# Patient Record
Sex: Female | Born: 1985 | Race: White | Hispanic: No | Marital: Married | Smoking: Never smoker
Health system: Southern US, Community
[De-identification: ages and names within clinical notes are randomized; demographics above are authoritative.]

## PROBLEM LIST (undated history)

## (undated) DIAGNOSIS — O24419 Gestational diabetes mellitus in pregnancy, unspecified control: Secondary | ICD-10-CM

## (undated) DIAGNOSIS — O139 Gestational [pregnancy-induced] hypertension without significant proteinuria, unspecified trimester: Secondary | ICD-10-CM

## (undated) DIAGNOSIS — O43129 Velamentous insertion of umbilical cord, unspecified trimester: Secondary | ICD-10-CM

## (undated) DIAGNOSIS — J302 Other seasonal allergic rhinitis: Secondary | ICD-10-CM

## (undated) DIAGNOSIS — Z789 Other specified health status: Secondary | ICD-10-CM

## (undated) HISTORY — DX: Other specified health status: Z78.9

## (undated) HISTORY — PX: WISDOM TOOTH EXTRACTION: SHX21

## (undated) HISTORY — DX: Other seasonal allergic rhinitis: J30.2

## (undated) HISTORY — DX: Gestational diabetes mellitus in pregnancy, unspecified control: O24.419

## (undated) HISTORY — DX: Gestational (pregnancy-induced) hypertension without significant proteinuria, unspecified trimester: O13.9

## (undated) HISTORY — DX: Velamentous insertion of umbilical cord, unspecified trimester: O43.129

---

## 2010-06-28 ENCOUNTER — Emergency Department (HOSPITAL_COMMUNITY): Payer: Private Health Insurance - Indemnity

## 2010-06-28 ENCOUNTER — Emergency Department (HOSPITAL_COMMUNITY)
Admission: EM | Admit: 2010-06-28 | Discharge: 2010-06-28 | Disposition: A | Discharge status: Home / Self Care | Payer: Private Health Insurance - Indemnity | Attending: Emergency Medicine | Admitting: Emergency Medicine

## 2010-06-28 DIAGNOSIS — R110 Nausea: Secondary | ICD-10-CM | POA: Insufficient documentation

## 2010-06-28 DIAGNOSIS — R1011 Right upper quadrant pain: Secondary | ICD-10-CM | POA: Insufficient documentation

## 2010-06-28 LAB — COMPREHENSIVE METABOLIC PANEL
ALT: 19 U/L (ref 0–35)
AST: 25 U/L (ref 0–37)
Albumin: 4.5 g/dL (ref 3.5–5.2)
Chloride: 104 mEq/L (ref 96–112)
Creatinine, Ser: 0.88 mg/dL (ref 0.4–1.2)
GFR calc Af Amer: >60 mL/min (ref >60)
Potassium: 3.7 mEq/L (ref 3.5–5.1)
Sodium: 136 mEq/L (ref 135–145)
Total Bilirubin: 0.5 mg/dL (ref 0.3–1.2)

## 2010-06-28 LAB — URINALYSIS, ROUTINE W REFLEX MICROSCOPIC
Glucose, UA: NEGATIVE mg/dL
Hgb urine dipstick: NEGATIVE
pH: 6 (ref 5.0–8.0)

## 2010-06-28 LAB — CBC
Hemoglobin: 14.5 g/dL (ref 12.0–15.0)
MCH: 31.1 pg (ref 26.0–34.0)
Platelets: 228 10*3/uL (ref 150–400)
RBC: 4.66 MIL/uL (ref 3.87–5.11)
WBC: 13 10*3/uL — ABNORMAL HIGH (ref 4.0–10.5)

## 2010-06-28 LAB — DIFFERENTIAL
Basophils Absolute: 0 10*3/uL (ref 0.0–0.1)
Basophils Relative: 0 % (ref 0–1)
Eosinophils Absolute: 0.1 10*3/uL (ref 0.0–0.7)
Neutro Abs: 10.3 10*3/uL — ABNORMAL HIGH (ref 1.7–7.7)
Neutrophils Relative %: 79 % — ABNORMAL HIGH (ref 43–77)

## 2010-06-28 LAB — URINE MICROSCOPIC-ADD ON

## 2015-06-08 ENCOUNTER — Ambulatory Visit (INDEPENDENT_AMBULATORY_CARE_PROVIDER_SITE_OTHER): Payer: BLUE CROSS/BLUE SHIELD | Admitting: Family Medicine

## 2015-06-08 ENCOUNTER — Encounter: Payer: Self-pay | Admitting: Family Medicine

## 2015-06-08 VITALS — BP 143/94 | HR 67 | Ht 61.0 in | Wt 125.0 lb

## 2015-06-08 DIAGNOSIS — S86811A Strain of other muscle(s) and tendon(s) at lower leg level, right leg, initial encounter: Secondary | ICD-10-CM

## 2015-06-08 NOTE — Patient Instructions (Addendum)
You have a calf strain (medial gastrocnemius muscle). Compression sleeve or ace wrap to help with swelling and pain. Icing for 15 minutes at a time 3-4 times a day Elevation above the level of your heart as much as possible. Heel lifts either in temporary orthotics or on their own to prevent further strain. Aleve 2 tabs twice a day with food OR ibuprofen 600mg  three times a day with food for pain and inflammation for 7-10 days then as needed. Start ankle range of motion exercises (up/down and alphabet exercises). When tolerated do double leg calf raises - advance to single leg, finally to doing these on a step.  3 sets of 10 once a day. When able to single leg, you are not limping, and pain is less than 3 on a scale of 1-10 you can try walk: jog program - I doubt you will be able to do this before I see you back. Cycling with low resistance or swimming for cross training in meantime. Follow up with me in 2 weeks.

## 2015-06-11 DIAGNOSIS — S86819A Strain of other muscle(s) and tendon(s) at lower leg level, unspecified leg, initial encounter: Secondary | ICD-10-CM | POA: Insufficient documentation

## 2015-06-11 NOTE — Assessment & Plan Note (Signed)
compression, icing, elevation.  NSAIDs for 7-10 days then as needed.  Shown home exercises to start now and advance to calf raises.  Cross training in meantime.  F/u in 2 weeks for reevaluation.

## 2015-06-11 NOTE — Progress Notes (Signed)
PCP: No primary care provider on file.  Subjective:   HPI: Patient is a 30 y.o. female here for right calf pain.  Patient reports on Tuesday she felt some cramping in her right calf after a 9 mile run. Did ok after this, rested through Friday with some soreness still. Then on Saturday 3 miles into a run she felt a pop medial calf. Is training for a marathon on May 19th. No bruising.  Feels tight. Pain is 3/10 but up to 7/10 if she tries to run, sharp. Wearing compression sleeve, icing. No skin changes, fever.  No past medical history on file.  No current outpatient prescriptions on file prior to visit.   No current facility-administered medications on file prior to visit.    No past surgical history on file.  No Known Allergies  Social History   Social History  . Marital Status: Married    Spouse Name: N/A  . Number of Children: N/A  . Years of Education: N/A   Occupational History  . Not on file.   Social History Main Topics  . Smoking status: Never Smoker   . Smokeless tobacco: Not on file  . Alcohol Use: Not on file  . Drug Use: Not on file  . Sexual Activity: Not on file   Other Topics Concern  . Not on file   Social History Narrative  . No narrative on file    No family history on file.  BP 143/94 mmHg  Pulse 67  Ht 5\' 1"  (1.549 m)  Wt 125 lb (56.7 kg)  BMI 23.63 kg/m2  Review of Systems: See HPI above.    Objective:  Physical Exam:  Gen: NAD, comfortable in exam room  Right lower leg: No gross deformity, obvious swelling or bruising. TTP with spasm medial gastroc.  No other focal tenderness. FROM ankle - pain attempting calf raise and with plantarflexion. NVI distally.    Assessment & Plan:  1. Right calf strain - compression, icing, elevation.  NSAIDs for 7-10 days then as needed.  Shown home exercises to start now and advance to calf raises.  Cross training in meantime.  F/u in 2 weeks for reevaluation.

## 2015-06-22 ENCOUNTER — Encounter: Payer: Self-pay | Admitting: Family Medicine

## 2015-06-22 ENCOUNTER — Ambulatory Visit (INDEPENDENT_AMBULATORY_CARE_PROVIDER_SITE_OTHER): Payer: BLUE CROSS/BLUE SHIELD | Admitting: Family Medicine

## 2015-06-22 VITALS — BP 133/87 | HR 56 | Ht 61.0 in | Wt 120.0 lb

## 2015-06-22 DIAGNOSIS — S86811D Strain of other muscle(s) and tendon(s) at lower leg level, right leg, subsequent encounter: Secondary | ICD-10-CM

## 2015-06-22 NOTE — Patient Instructions (Signed)
Continue with the home exercises through the race even if you're feeling good. Icing still 15 minutes at a time 3-4 times a day. Compression sleeve when running/exercising only now. Continue heel lifts. Ibuprofen only if needed at this point. Ok to start walk/jog 1 minute:1 minute for total of 10 minutes on level surface (track or treadmill) - every other day increase (so 2:1 jog: walk for 15 minutes, 3:1 WUJ:WJXBjog:walk for 20 minutes, etc) - be very slow about this first week. I'd wait about 2 weeks before I tried adding hills or any incline/decline to your runs. Cycling with low resistance or swimming for cross training on off days. Let me know how you're both doing in 2 weeks - typically I would recommend following up with me in 4 weeks. Call me if you want to do physical therapy and we will put in a referral for you.

## 2015-06-24 DIAGNOSIS — J45909 Unspecified asthma, uncomplicated: Secondary | ICD-10-CM | POA: Diagnosis not present

## 2015-06-24 DIAGNOSIS — R05 Cough: Secondary | ICD-10-CM | POA: Diagnosis not present

## 2015-06-24 NOTE — Assessment & Plan Note (Signed)
Clinically improving.  Ok at this point to start walk/jog program.  Continue heel lifts, compression sleeve with running.  Icing regularly, tylenol/nsaids if needed.  Call us in 2 weeks for an update on her status - F/u in 4 weeks.  Consider physical therapy if she is struggling.

## 2015-06-24 NOTE — Progress Notes (Signed)
PCP: No primary care provider on file.  Subjective:   HPI: Patient is a 30 y.o. female here for right calf pain.  3/20: Patient reports on Tuesday she felt some cramping in her right calf after a 9 mile run. Did ok after this, rested through Friday with some soreness still. Then on Saturday 3 miles into a run she felt a pop medial calf. Is training for a marathon on May 19th. No bruising.  Feels tight. Pain is 3/10 but up to 7/10 if she tries to run, sharp. Wearing compression sleeve, icing. No skin changes, fever.  4/4: Patient reports she has improved since last visit. Still feels this if trying single calf raise. Doing home exercises. Has not tried running yet - only cross training. Using heel lifts, compression sleeve. No skin changes, fever.  No past medical history on file.  No current outpatient prescriptions on file prior to visit.   No current facility-administered medications on file prior to visit.    No past surgical history on file.  No Known Allergies  Social History   Social History  . Marital Status: Married    Spouse Name: N/A  . Number of Children: N/A  . Years of Education: N/A   Occupational History  . Not on file.   Social History Main Topics  . Smoking status: Never Smoker   . Smokeless tobacco: Not on file  . Alcohol Use: Not on file  . Drug Use: Not on file  . Sexual Activity: Not on file   Other Topics Concern  . Not on file   Social History Narrative    No family history on file.  BP 133/87 mmHg  Pulse 56  Ht 5\' 1"  (1.549 m)  Wt 120 lb (54.432 kg)  BMI 22.69 kg/m2  Review of Systems: See HPI above.    Objective:  Physical Exam:  Gen: NAD, comfortable in exam room  Right lower leg: No gross deformity, obvious swelling or bruising. No tenderness now. FROM ankle - pain on single calf raise but able to do so. NVI distally.    Assessment & Plan:  1. Right calf strain - Clinically improving.  Ok at this point to  start walk/jog program.  Continue heel lifts, compression sleeve with running.  Icing regularly, tylenol/nsaids if needed.  Call us in 2 weeks for an update on her status - F/u in 4 weeks.  Consider physical therapy if she is struggling.

## 2015-07-09 DIAGNOSIS — Z0000 Encounter for general adult medical examination without abnormal findings: Secondary | ICD-10-CM | POA: Diagnosis not present

## 2015-07-09 DIAGNOSIS — R739 Hyperglycemia, unspecified: Secondary | ICD-10-CM | POA: Diagnosis not present

## 2015-07-13 DIAGNOSIS — K5900 Constipation, unspecified: Secondary | ICD-10-CM | POA: Diagnosis not present

## 2015-07-13 DIAGNOSIS — Z1389 Encounter for screening for other disorder: Secondary | ICD-10-CM | POA: Diagnosis not present

## 2015-07-13 DIAGNOSIS — Z0000 Encounter for general adult medical examination without abnormal findings: Secondary | ICD-10-CM | POA: Diagnosis not present

## 2015-07-13 DIAGNOSIS — J302 Other seasonal allergic rhinitis: Secondary | ICD-10-CM | POA: Diagnosis not present

## 2015-07-13 DIAGNOSIS — G4709 Other insomnia: Secondary | ICD-10-CM | POA: Diagnosis not present

## 2016-01-12 DIAGNOSIS — Z01419 Encounter for gynecological examination (general) (routine) without abnormal findings: Secondary | ICD-10-CM | POA: Diagnosis not present

## 2016-01-12 DIAGNOSIS — Z6823 Body mass index (BMI) 23.0-23.9, adult: Secondary | ICD-10-CM | POA: Diagnosis not present

## 2016-01-12 DIAGNOSIS — Z1151 Encounter for screening for human papillomavirus (HPV): Secondary | ICD-10-CM | POA: Diagnosis not present

## 2016-06-27 DIAGNOSIS — Z30433 Encounter for removal and reinsertion of intrauterine contraceptive device: Secondary | ICD-10-CM | POA: Diagnosis not present

## 2016-06-27 DIAGNOSIS — N854 Malposition of uterus: Secondary | ICD-10-CM | POA: Diagnosis not present

## 2016-07-13 DIAGNOSIS — Z0000 Encounter for general adult medical examination without abnormal findings: Secondary | ICD-10-CM | POA: Diagnosis not present

## 2016-07-22 DIAGNOSIS — Z1389 Encounter for screening for other disorder: Secondary | ICD-10-CM | POA: Diagnosis not present

## 2016-07-22 DIAGNOSIS — R030 Elevated blood-pressure reading, without diagnosis of hypertension: Secondary | ICD-10-CM | POA: Diagnosis not present

## 2016-07-22 DIAGNOSIS — Z0000 Encounter for general adult medical examination without abnormal findings: Secondary | ICD-10-CM | POA: Diagnosis not present

## 2016-07-22 DIAGNOSIS — G4700 Insomnia, unspecified: Secondary | ICD-10-CM | POA: Diagnosis not present

## 2016-07-22 DIAGNOSIS — J302 Other seasonal allergic rhinitis: Secondary | ICD-10-CM | POA: Diagnosis not present

## 2016-07-22 DIAGNOSIS — K5900 Constipation, unspecified: Secondary | ICD-10-CM | POA: Diagnosis not present

## 2016-07-27 DIAGNOSIS — Z30431 Encounter for routine checking of intrauterine contraceptive device: Secondary | ICD-10-CM | POA: Diagnosis not present

## 2016-09-26 DIAGNOSIS — Z6824 Body mass index (BMI) 24.0-24.9, adult: Secondary | ICD-10-CM | POA: Diagnosis not present

## 2016-09-26 DIAGNOSIS — R030 Elevated blood-pressure reading, without diagnosis of hypertension: Secondary | ICD-10-CM | POA: Diagnosis not present

## 2016-09-26 DIAGNOSIS — J302 Other seasonal allergic rhinitis: Secondary | ICD-10-CM | POA: Diagnosis not present

## 2017-03-08 DIAGNOSIS — L700 Acne vulgaris: Secondary | ICD-10-CM | POA: Diagnosis not present

## 2017-03-08 DIAGNOSIS — Z79899 Other long term (current) drug therapy: Secondary | ICD-10-CM | POA: Diagnosis not present

## 2017-04-12 DIAGNOSIS — L700 Acne vulgaris: Secondary | ICD-10-CM | POA: Diagnosis not present

## 2017-04-12 DIAGNOSIS — Z79899 Other long term (current) drug therapy: Secondary | ICD-10-CM | POA: Diagnosis not present

## 2017-05-15 DIAGNOSIS — Z79899 Other long term (current) drug therapy: Secondary | ICD-10-CM | POA: Diagnosis not present

## 2017-05-15 DIAGNOSIS — L700 Acne vulgaris: Secondary | ICD-10-CM | POA: Diagnosis not present

## 2017-06-12 DIAGNOSIS — Z79899 Other long term (current) drug therapy: Secondary | ICD-10-CM | POA: Diagnosis not present

## 2017-06-12 DIAGNOSIS — L700 Acne vulgaris: Secondary | ICD-10-CM | POA: Diagnosis not present

## 2017-07-13 DIAGNOSIS — Z79899 Other long term (current) drug therapy: Secondary | ICD-10-CM | POA: Diagnosis not present

## 2017-07-13 DIAGNOSIS — L700 Acne vulgaris: Secondary | ICD-10-CM | POA: Diagnosis not present

## 2017-08-15 DIAGNOSIS — L700 Acne vulgaris: Secondary | ICD-10-CM | POA: Diagnosis not present

## 2017-08-15 DIAGNOSIS — Z79899 Other long term (current) drug therapy: Secondary | ICD-10-CM | POA: Diagnosis not present

## 2017-09-27 DIAGNOSIS — Z79899 Other long term (current) drug therapy: Secondary | ICD-10-CM | POA: Diagnosis not present

## 2017-09-27 DIAGNOSIS — L700 Acne vulgaris: Secondary | ICD-10-CM | POA: Diagnosis not present

## 2017-10-27 DIAGNOSIS — Z79899 Other long term (current) drug therapy: Secondary | ICD-10-CM | POA: Diagnosis not present

## 2017-10-27 DIAGNOSIS — L700 Acne vulgaris: Secondary | ICD-10-CM | POA: Diagnosis not present

## 2017-10-27 DIAGNOSIS — L011 Impetiginization of other dermatoses: Secondary | ICD-10-CM | POA: Diagnosis not present

## 2017-11-27 DIAGNOSIS — Z79899 Other long term (current) drug therapy: Secondary | ICD-10-CM | POA: Diagnosis not present

## 2017-12-25 DIAGNOSIS — R87810 Cervical high risk human papillomavirus (HPV) DNA test positive: Secondary | ICD-10-CM | POA: Diagnosis not present

## 2017-12-25 DIAGNOSIS — Z6824 Body mass index (BMI) 24.0-24.9, adult: Secondary | ICD-10-CM | POA: Diagnosis not present

## 2017-12-25 DIAGNOSIS — Z1151 Encounter for screening for human papillomavirus (HPV): Secondary | ICD-10-CM | POA: Diagnosis not present

## 2017-12-25 DIAGNOSIS — Z01419 Encounter for gynecological examination (general) (routine) without abnormal findings: Secondary | ICD-10-CM | POA: Diagnosis not present

## 2018-01-30 DIAGNOSIS — Z0000 Encounter for general adult medical examination without abnormal findings: Secondary | ICD-10-CM | POA: Diagnosis not present

## 2018-02-05 DIAGNOSIS — Z1389 Encounter for screening for other disorder: Secondary | ICD-10-CM | POA: Diagnosis not present

## 2018-02-05 DIAGNOSIS — G4709 Other insomnia: Secondary | ICD-10-CM | POA: Diagnosis not present

## 2018-02-05 DIAGNOSIS — L659 Nonscarring hair loss, unspecified: Secondary | ICD-10-CM | POA: Diagnosis not present

## 2018-02-05 DIAGNOSIS — K5900 Constipation, unspecified: Secondary | ICD-10-CM | POA: Diagnosis not present

## 2018-02-05 DIAGNOSIS — Z0000 Encounter for general adult medical examination without abnormal findings: Secondary | ICD-10-CM | POA: Diagnosis not present

## 2018-02-05 DIAGNOSIS — J302 Other seasonal allergic rhinitis: Secondary | ICD-10-CM | POA: Diagnosis not present

## 2019-01-24 DIAGNOSIS — D225 Melanocytic nevi of trunk: Secondary | ICD-10-CM | POA: Diagnosis not present

## 2019-01-24 DIAGNOSIS — D2272 Melanocytic nevi of left lower limb, including hip: Secondary | ICD-10-CM | POA: Diagnosis not present

## 2019-01-24 DIAGNOSIS — D2262 Melanocytic nevi of left upper limb, including shoulder: Secondary | ICD-10-CM | POA: Diagnosis not present

## 2019-01-24 DIAGNOSIS — D2261 Melanocytic nevi of right upper limb, including shoulder: Secondary | ICD-10-CM | POA: Diagnosis not present

## 2019-01-24 DIAGNOSIS — D2239 Melanocytic nevi of other parts of face: Secondary | ICD-10-CM | POA: Diagnosis not present

## 2019-02-18 DIAGNOSIS — R7989 Other specified abnormal findings of blood chemistry: Secondary | ICD-10-CM | POA: Diagnosis not present

## 2019-02-18 DIAGNOSIS — R82998 Other abnormal findings in urine: Secondary | ICD-10-CM | POA: Diagnosis not present

## 2019-02-18 DIAGNOSIS — Z0000 Encounter for general adult medical examination without abnormal findings: Secondary | ICD-10-CM | POA: Diagnosis not present

## 2019-02-25 DIAGNOSIS — Z1331 Encounter for screening for depression: Secondary | ICD-10-CM | POA: Diagnosis not present

## 2019-02-25 DIAGNOSIS — K5900 Constipation, unspecified: Secondary | ICD-10-CM | POA: Diagnosis not present

## 2019-02-25 DIAGNOSIS — G4700 Insomnia, unspecified: Secondary | ICD-10-CM | POA: Diagnosis not present

## 2019-02-25 DIAGNOSIS — J302 Other seasonal allergic rhinitis: Secondary | ICD-10-CM | POA: Diagnosis not present

## 2019-02-25 DIAGNOSIS — Z0000 Encounter for general adult medical examination without abnormal findings: Secondary | ICD-10-CM | POA: Diagnosis not present

## 2019-02-26 DIAGNOSIS — Z6826 Body mass index (BMI) 26.0-26.9, adult: Secondary | ICD-10-CM | POA: Diagnosis not present

## 2019-02-26 DIAGNOSIS — Z01419 Encounter for gynecological examination (general) (routine) without abnormal findings: Secondary | ICD-10-CM | POA: Diagnosis not present

## 2020-01-11 DIAGNOSIS — S299XXA Unspecified injury of thorax, initial encounter: Secondary | ICD-10-CM | POA: Diagnosis not present

## 2020-01-11 DIAGNOSIS — S20211A Contusion of right front wall of thorax, initial encounter: Secondary | ICD-10-CM | POA: Diagnosis not present

## 2020-01-11 DIAGNOSIS — R0781 Pleurodynia: Secondary | ICD-10-CM | POA: Diagnosis not present

## 2020-06-23 DIAGNOSIS — Z0000 Encounter for general adult medical examination without abnormal findings: Secondary | ICD-10-CM | POA: Diagnosis not present

## 2020-07-14 DIAGNOSIS — G4700 Insomnia, unspecified: Secondary | ICD-10-CM | POA: Diagnosis not present

## 2020-07-14 DIAGNOSIS — Z0000 Encounter for general adult medical examination without abnormal findings: Secondary | ICD-10-CM | POA: Diagnosis not present

## 2020-08-03 DIAGNOSIS — Z30432 Encounter for removal of intrauterine contraceptive device: Secondary | ICD-10-CM | POA: Diagnosis not present

## 2020-08-03 DIAGNOSIS — Z113 Encounter for screening for infections with a predominantly sexual mode of transmission: Secondary | ICD-10-CM | POA: Diagnosis not present

## 2020-08-03 DIAGNOSIS — Z124 Encounter for screening for malignant neoplasm of cervix: Secondary | ICD-10-CM | POA: Diagnosis not present

## 2020-08-03 DIAGNOSIS — Z6826 Body mass index (BMI) 26.0-26.9, adult: Secondary | ICD-10-CM | POA: Diagnosis not present

## 2020-08-03 DIAGNOSIS — Z01419 Encounter for gynecological examination (general) (routine) without abnormal findings: Secondary | ICD-10-CM | POA: Diagnosis not present

## 2020-11-24 DIAGNOSIS — O26859 Spotting complicating pregnancy, unspecified trimester: Secondary | ICD-10-CM | POA: Diagnosis not present

## 2020-11-24 DIAGNOSIS — Z3200 Encounter for pregnancy test, result unknown: Secondary | ICD-10-CM | POA: Diagnosis not present

## 2020-11-24 DIAGNOSIS — Z3689 Encounter for other specified antenatal screening: Secondary | ICD-10-CM | POA: Diagnosis not present

## 2020-11-26 DIAGNOSIS — O26859 Spotting complicating pregnancy, unspecified trimester: Secondary | ICD-10-CM | POA: Diagnosis not present

## 2020-11-26 DIAGNOSIS — Z3A01 Less than 8 weeks gestation of pregnancy: Secondary | ICD-10-CM | POA: Diagnosis not present

## 2020-12-01 DIAGNOSIS — O209 Hemorrhage in early pregnancy, unspecified: Secondary | ICD-10-CM | POA: Diagnosis not present

## 2020-12-01 DIAGNOSIS — Z3A01 Less than 8 weeks gestation of pregnancy: Secondary | ICD-10-CM | POA: Diagnosis not present

## 2020-12-17 DIAGNOSIS — Z3201 Encounter for pregnancy test, result positive: Secondary | ICD-10-CM | POA: Diagnosis not present

## 2021-01-05 DIAGNOSIS — O09511 Supervision of elderly primigravida, first trimester: Secondary | ICD-10-CM | POA: Diagnosis not present

## 2021-01-05 DIAGNOSIS — Z3A10 10 weeks gestation of pregnancy: Secondary | ICD-10-CM | POA: Diagnosis not present

## 2021-01-05 DIAGNOSIS — Z36 Encounter for antenatal screening for chromosomal anomalies: Secondary | ICD-10-CM | POA: Diagnosis not present

## 2021-01-05 DIAGNOSIS — Z3689 Encounter for other specified antenatal screening: Secondary | ICD-10-CM | POA: Diagnosis not present

## 2021-01-05 LAB — OB RESULTS CONSOLE HIV ANTIBODY (ROUTINE TESTING): HIV: NONREACTIVE

## 2021-01-05 LAB — OB RESULTS CONSOLE GC/CHLAMYDIA
Chlamydia: NEGATIVE
Gonorrhea: NEGATIVE

## 2021-01-05 LAB — OB RESULTS CONSOLE RUBELLA ANTIBODY, IGM: Rubella: IMMUNE

## 2021-01-05 LAB — OB RESULTS CONSOLE HEPATITIS B SURFACE ANTIGEN: Hepatitis B Surface Ag: NEGATIVE

## 2021-01-05 LAB — OB RESULTS CONSOLE RPR: RPR: NONREACTIVE

## 2021-02-02 DIAGNOSIS — Z23 Encounter for immunization: Secondary | ICD-10-CM | POA: Diagnosis not present

## 2021-02-23 DIAGNOSIS — Z361 Encounter for antenatal screening for raised alphafetoprotein level: Secondary | ICD-10-CM | POA: Diagnosis not present

## 2021-03-10 DIAGNOSIS — Z363 Encounter for antenatal screening for malformations: Secondary | ICD-10-CM | POA: Diagnosis not present

## 2021-03-21 NOTE — L&D Delivery Note (Signed)
Delivery Note ?At 4:57 AM a viable and healthy female was delivered via Vaginal, Spontaneous (Presentation: Left Occiput Anterior).  APGAR: 8, 9; weight pending .   ?Placenta status: Spontaneous;Pathology, Intact.  Cord: 3 vessels with the following complications: None. Loose nuchal cord x1 at delivery. Inspection of placenta notes velamentous insertion of umbilical cord, sent for pathology.  Cord pH: N/A ? ?Anesthesia: Epidural ?Episiotomy: None ?Lacerations: 1st degree b/l vaginal lacerations ?Suture Repair: 3.0 vicryl ?Est. Blood Loss (mL): 75 ? ?Mom to postpartum.  Baby to Couplet care / Skin to Skin. ? ?Jamie Acosta ?07/11/2021, 5:48 AM ? ? ? ?

## 2021-04-01 DIAGNOSIS — Z362 Encounter for other antenatal screening follow-up: Secondary | ICD-10-CM | POA: Diagnosis not present

## 2021-04-14 DIAGNOSIS — O43122 Velamentous insertion of umbilical cord, second trimester: Secondary | ICD-10-CM | POA: Diagnosis not present

## 2021-04-21 ENCOUNTER — Other Ambulatory Visit: Payer: Self-pay | Admitting: Obstetrics and Gynecology

## 2021-04-21 DIAGNOSIS — Z363 Encounter for antenatal screening for malformations: Secondary | ICD-10-CM

## 2021-04-27 ENCOUNTER — Ambulatory Visit: Payer: BC Managed Care – PPO

## 2021-04-27 ENCOUNTER — Ambulatory Visit: Payer: BC Managed Care – PPO | Attending: Obstetrics and Gynecology

## 2021-04-27 ENCOUNTER — Other Ambulatory Visit: Payer: Self-pay | Admitting: Obstetrics and Gynecology

## 2021-04-27 ENCOUNTER — Other Ambulatory Visit: Payer: Self-pay

## 2021-04-27 ENCOUNTER — Ambulatory Visit (HOSPITAL_BASED_OUTPATIENT_CLINIC_OR_DEPARTMENT_OTHER): Payer: BC Managed Care – PPO | Admitting: *Deleted

## 2021-04-27 ENCOUNTER — Ambulatory Visit: Payer: BC Managed Care – PPO | Admitting: *Deleted

## 2021-04-27 ENCOUNTER — Ambulatory Visit (HOSPITAL_BASED_OUTPATIENT_CLINIC_OR_DEPARTMENT_OTHER): Payer: BC Managed Care – PPO | Admitting: Maternal & Fetal Medicine

## 2021-04-27 VITALS — BP 127/89 | HR 75

## 2021-04-27 DIAGNOSIS — O365920 Maternal care for other known or suspected poor fetal growth, second trimester, not applicable or unspecified: Secondary | ICD-10-CM

## 2021-04-27 DIAGNOSIS — Z363 Encounter for antenatal screening for malformations: Secondary | ICD-10-CM

## 2021-04-27 DIAGNOSIS — O43122 Velamentous insertion of umbilical cord, second trimester: Secondary | ICD-10-CM | POA: Diagnosis not present

## 2021-04-27 DIAGNOSIS — O365990 Maternal care for other known or suspected poor fetal growth, unspecified trimester, not applicable or unspecified: Secondary | ICD-10-CM | POA: Diagnosis not present

## 2021-04-27 DIAGNOSIS — O09522 Supervision of elderly multigravida, second trimester: Secondary | ICD-10-CM | POA: Diagnosis not present

## 2021-04-27 DIAGNOSIS — O365921 Maternal care for other known or suspected poor fetal growth, second trimester, fetus 1: Secondary | ICD-10-CM

## 2021-04-27 DIAGNOSIS — Z3A26 26 weeks gestation of pregnancy: Secondary | ICD-10-CM

## 2021-04-27 DIAGNOSIS — O43123 Velamentous insertion of umbilical cord, third trimester: Secondary | ICD-10-CM | POA: Insufficient documentation

## 2021-04-27 NOTE — Progress Notes (Signed)
MFM Brief Note  Jamie Acosta is a G1P0 who is seen today at 81 w 6 day with an EDD of 07/28/21.  She is seen at the request of Dr. Clance Boll regarding a new finding of fetal growth restriction (EFW 10% with normal UA Dopplers).  Single intrauterine pregnancy here for a follow up growth due to late prenatal care. Normal anatomy with measurements less than dates due to IUGR EFW 2.3 rd%.  There is good fetal movement and amniotic fluid volume The UA Dopplers are normal without evidence of AEDF or REDF.  NST was reactive.   I discussed today's visit with a diagnosis of IUGR. I explained that the etiology includes placental insufficiency, chronic disease, infection, aneuploidy and other genetic syndromes. She has a low risk NIPS.  There are no markers for aneuploidy seen.  She has no additional risk factors for chronic disease, however, she does a have a velamentous cord insertion. I explained that a VCI is associated with an increased risk small for gestational age and fetal growth restriction.   At this time I explained the diagnosis, evaluation and management to include on serial growth with weekly antenatal testing to include UA Dopplers. TORCH titers were drawn today as well, however, I explained that no additional markers were observed.  If the EFW persist in the severe range consider 2x weekly NST.  Given the EFW < 3rd% I recommend delivery at 37 weeks  I spent 30 minutes with > 50% in face to face consultation  Novella Olive, MD.

## 2021-04-27 NOTE — Procedures (Signed)
Jamie Acosta 1985/07/10 [redacted]w[redacted]d  Fetus A Non-Stress Test Interpretation for 04/27/21  Indication: IUGR  Fetal Heart Rate A Mode: External Baseline Rate (A): 135 bpm Variability: Moderate Accelerations: 10 x 10 Decelerations: None  Uterine Activity Mode: Toco Contraction Frequency (min): none Resting Tone Palpated: Relaxed  Interpretation (Fetal Testing) Nonstress Test Interpretation: Reactive Overall Impression: Reassuring for gestational age Comments: tracing reviewed by Dr. Grace Bushy

## 2021-04-28 ENCOUNTER — Other Ambulatory Visit: Payer: Self-pay | Admitting: *Deleted

## 2021-04-28 DIAGNOSIS — O365990 Maternal care for other known or suspected poor fetal growth, unspecified trimester, not applicable or unspecified: Secondary | ICD-10-CM

## 2021-04-28 DIAGNOSIS — O43122 Velamentous insertion of umbilical cord, second trimester: Secondary | ICD-10-CM

## 2021-04-28 DIAGNOSIS — O09512 Supervision of elderly primigravida, second trimester: Secondary | ICD-10-CM

## 2021-04-28 LAB — CMV IGM: CMV IgM Ser EIA-aCnc: <30 [AU]/ml (ref 0.0–29.9)

## 2021-04-28 LAB — PARVOVIRUS B19 ANTIBODY, IGG AND IGM
Parvovirus B19 IgG: 3.8 {index} — ABNORMAL HIGH (ref 0.0–0.8)
Parvovirus B19 IgM: 0.1 {index} (ref 0.0–0.8)

## 2021-04-28 LAB — TOXOPLASMA GONDII ANTIBODY, IGM: Toxoplasma Antibody- IgM: <3 [AU]/ml (ref 0.0–7.9)

## 2021-04-28 LAB — TOXOPLASMA GONDII ANTIBODY, IGG: Toxoplasma IgG Ratio: <3 [IU]/mL (ref 0.0–7.1)

## 2021-04-28 LAB — INFECT DISEASE AB IGM REFLEX 1

## 2021-04-28 LAB — CMV ANTIBODY, IGG (EIA): CMV Ab - IgG: 5.6 U/mL — ABNORMAL HIGH (ref 0.00–0.59)

## 2021-04-30 ENCOUNTER — Encounter: Payer: Self-pay | Admitting: Maternal & Fetal Medicine

## 2021-05-04 ENCOUNTER — Ambulatory Visit: Payer: BC Managed Care – PPO | Attending: Maternal & Fetal Medicine

## 2021-05-04 ENCOUNTER — Ambulatory Visit: Payer: BC Managed Care – PPO | Admitting: *Deleted

## 2021-05-04 ENCOUNTER — Other Ambulatory Visit: Payer: Self-pay

## 2021-05-04 ENCOUNTER — Ambulatory Visit (HOSPITAL_BASED_OUTPATIENT_CLINIC_OR_DEPARTMENT_OTHER): Payer: BC Managed Care – PPO | Admitting: *Deleted

## 2021-05-04 VITALS — BP 142/87

## 2021-05-04 VITALS — BP 138/101 | HR 70

## 2021-05-04 DIAGNOSIS — O365990 Maternal care for other known or suspected poor fetal growth, unspecified trimester, not applicable or unspecified: Secondary | ICD-10-CM | POA: Insufficient documentation

## 2021-05-04 DIAGNOSIS — O09522 Supervision of elderly multigravida, second trimester: Secondary | ICD-10-CM | POA: Insufficient documentation

## 2021-05-04 DIAGNOSIS — O365920 Maternal care for other known or suspected poor fetal growth, second trimester, not applicable or unspecified: Secondary | ICD-10-CM

## 2021-05-04 DIAGNOSIS — O09512 Supervision of elderly primigravida, second trimester: Secondary | ICD-10-CM | POA: Insufficient documentation

## 2021-05-04 DIAGNOSIS — O43122 Velamentous insertion of umbilical cord, second trimester: Secondary | ICD-10-CM | POA: Insufficient documentation

## 2021-05-04 DIAGNOSIS — Z3A27 27 weeks gestation of pregnancy: Secondary | ICD-10-CM

## 2021-05-04 NOTE — Procedures (Signed)
Jamie Acosta 1985-07-27 [redacted]w[redacted]d  Fetus A Non-Stress Test Interpretation for 05/04/21  Indication: IUGR  Fetal Heart Rate A Mode: External Baseline Rate (A): 140 bpm Variability: Moderate Accelerations: 15 x 15 Decelerations: None Multiple birth?: No  Uterine Activity Mode: Palpation, Toco Contraction Frequency (min): None Resting Tone Palpated: Relaxed Resting Time: Adequate  Interpretation (Fetal Testing) Nonstress Test Interpretation: Reactive Comments: Dr. Donalee Citrin reviewed tracing.

## 2021-05-05 DIAGNOSIS — O139 Gestational [pregnancy-induced] hypertension without significant proteinuria, unspecified trimester: Secondary | ICD-10-CM | POA: Diagnosis not present

## 2021-05-05 DIAGNOSIS — Z3689 Encounter for other specified antenatal screening: Secondary | ICD-10-CM | POA: Diagnosis not present

## 2021-05-11 ENCOUNTER — Other Ambulatory Visit: Payer: Self-pay

## 2021-05-11 ENCOUNTER — Ambulatory Visit: Payer: BC Managed Care – PPO | Attending: Maternal & Fetal Medicine

## 2021-05-11 ENCOUNTER — Ambulatory Visit (HOSPITAL_BASED_OUTPATIENT_CLINIC_OR_DEPARTMENT_OTHER): Payer: BC Managed Care – PPO | Admitting: *Deleted

## 2021-05-11 ENCOUNTER — Ambulatory Visit: Payer: BC Managed Care – PPO | Admitting: *Deleted

## 2021-05-11 VITALS — BP 146/103 | HR 98

## 2021-05-11 DIAGNOSIS — Z3A28 28 weeks gestation of pregnancy: Secondary | ICD-10-CM | POA: Insufficient documentation

## 2021-05-11 DIAGNOSIS — O09512 Supervision of elderly primigravida, second trimester: Secondary | ICD-10-CM | POA: Insufficient documentation

## 2021-05-11 DIAGNOSIS — O365990 Maternal care for other known or suspected poor fetal growth, unspecified trimester, not applicable or unspecified: Secondary | ICD-10-CM | POA: Insufficient documentation

## 2021-05-11 DIAGNOSIS — O365930 Maternal care for other known or suspected poor fetal growth, third trimester, not applicable or unspecified: Secondary | ICD-10-CM

## 2021-05-11 DIAGNOSIS — O09523 Supervision of elderly multigravida, third trimester: Secondary | ICD-10-CM | POA: Diagnosis not present

## 2021-05-11 DIAGNOSIS — O43122 Velamentous insertion of umbilical cord, second trimester: Secondary | ICD-10-CM | POA: Insufficient documentation

## 2021-05-11 DIAGNOSIS — O43123 Velamentous insertion of umbilical cord, third trimester: Secondary | ICD-10-CM

## 2021-05-11 NOTE — Procedures (Signed)
Jamie Acosta [birth year] [redacted]w[redacted]d  Fetus A Non-Stress Test Interpretation for 05/11/21  Indication: IUGR  Fetal Heart Rate A Mode: External Baseline Rate (A): 145 bpm Variability: Moderate Accelerations: 10 x 10 Decelerations: None  Uterine Activity Mode: Toco Contraction Frequency (min): none Resting Tone Palpated: Relaxed  Interpretation (Fetal Testing) Nonstress Test Interpretation: Reactive Overall Impression: Reassuring for gestational age Comments: tracing reviewed by Dr. Judeth Cornfield

## 2021-05-12 DIAGNOSIS — O139 Gestational [pregnancy-induced] hypertension without significant proteinuria, unspecified trimester: Secondary | ICD-10-CM | POA: Diagnosis not present

## 2021-05-12 DIAGNOSIS — O99810 Abnormal glucose complicating pregnancy: Secondary | ICD-10-CM | POA: Diagnosis not present

## 2021-05-12 DIAGNOSIS — Z3A29 29 weeks gestation of pregnancy: Secondary | ICD-10-CM | POA: Diagnosis not present

## 2021-05-18 ENCOUNTER — Ambulatory Visit: Payer: BC Managed Care – PPO | Attending: Maternal & Fetal Medicine

## 2021-05-18 ENCOUNTER — Ambulatory Visit (HOSPITAL_BASED_OUTPATIENT_CLINIC_OR_DEPARTMENT_OTHER): Payer: BC Managed Care – PPO | Admitting: *Deleted

## 2021-05-18 ENCOUNTER — Ambulatory Visit: Payer: BC Managed Care – PPO | Admitting: *Deleted

## 2021-05-18 ENCOUNTER — Other Ambulatory Visit: Payer: Self-pay

## 2021-05-18 ENCOUNTER — Ambulatory Visit (HOSPITAL_BASED_OUTPATIENT_CLINIC_OR_DEPARTMENT_OTHER): Payer: BC Managed Care – PPO | Admitting: Maternal & Fetal Medicine

## 2021-05-18 VITALS — BP 143/102 | HR 83

## 2021-05-18 VITALS — BP 139/98

## 2021-05-18 DIAGNOSIS — O365931 Maternal care for other known or suspected poor fetal growth, third trimester, fetus 1: Secondary | ICD-10-CM

## 2021-05-18 DIAGNOSIS — O43123 Velamentous insertion of umbilical cord, third trimester: Secondary | ICD-10-CM

## 2021-05-18 DIAGNOSIS — O09513 Supervision of elderly primigravida, third trimester: Secondary | ICD-10-CM | POA: Diagnosis not present

## 2021-05-18 DIAGNOSIS — O365930 Maternal care for other known or suspected poor fetal growth, third trimester, not applicable or unspecified: Secondary | ICD-10-CM

## 2021-05-18 DIAGNOSIS — O09512 Supervision of elderly primigravida, second trimester: Secondary | ICD-10-CM | POA: Diagnosis not present

## 2021-05-18 DIAGNOSIS — O43122 Velamentous insertion of umbilical cord, second trimester: Secondary | ICD-10-CM | POA: Diagnosis not present

## 2021-05-18 DIAGNOSIS — O365920 Maternal care for other known or suspected poor fetal growth, second trimester, not applicable or unspecified: Secondary | ICD-10-CM | POA: Diagnosis not present

## 2021-05-18 DIAGNOSIS — O133 Gestational [pregnancy-induced] hypertension without significant proteinuria, third trimester: Secondary | ICD-10-CM | POA: Diagnosis not present

## 2021-05-18 DIAGNOSIS — O365990 Maternal care for other known or suspected poor fetal growth, unspecified trimester, not applicable or unspecified: Secondary | ICD-10-CM | POA: Diagnosis not present

## 2021-05-18 DIAGNOSIS — Z3A29 29 weeks gestation of pregnancy: Secondary | ICD-10-CM

## 2021-05-18 NOTE — Progress Notes (Signed)
MFM Brief Note  Ms. Maye Hides is here for follow up growth given prior exam of severe fetal growth restriction and known velamentous cord insertion.  She is seen again today at the request of Dr. Clance Boll  A single intrauterine pregnancy is again seen  Positive fetal growth with improved measurements to 7% from 2.3% EFW with improved AC as well There was good fetal movement and amniotic fluid. The UA Dopplers were normal without evidence of AEDF or REDF. NST today was reactive without evidence of decelerations.  Today we did observe elevated blood pressures of 157/97, 143/102 and 139/98 mmHg. She denies s/sx of preeclampsia.  She reports good fetal movement.  I spoke with Dr. Amado Nash the provider on call for her group she shared that the didnt have recent labs with exception of a normal UPC.  She is scheduled for a follow up  appt on Friday, however I requested that repeat BP and labs be performed in the next 24-48 hours.  I provided Ms. Deahl s/sx of preeclampsia including headache, vision changes and RUQ pain.  She is scheduled for repeat testing in 1 week.  If her blood pressure persist in the >145/95 consider initiating hypertensive therapy.  Initiate weekly labs with at that time and consider delivery at 37 weeks.   I spent 20 minutes with > 50% in face to face consulation.  Novella Olive, MD

## 2021-05-18 NOTE — Addendum Note (Signed)
Addended by: Vikki Ports on: 05/18/2021 04:46 PM   Modules accepted: Level of Service

## 2021-05-18 NOTE — Procedures (Signed)
Jamie Acosta 1986/03/18 [redacted]w[redacted]d  Fetus A Non-Stress Test Interpretation for 05/18/21  Indication: IUGR  Fetal Heart Rate A Mode: External Baseline Rate (A): 140 bpm Variability: Moderate Accelerations: 10 x 10 Decelerations: None Multiple birth?: No  Uterine Activity Mode: Palpation, Toco Contraction Frequency (min): None Resting Tone Palpated: Relaxed Resting Time: Adequate  Interpretation (Fetal Testing) Nonstress Test Interpretation: Reactive Overall Impression: Reassuring for gestational age Comments: Dr. Grace Bushy reviewed tracing.

## 2021-05-19 ENCOUNTER — Other Ambulatory Visit: Payer: Self-pay | Admitting: *Deleted

## 2021-05-19 ENCOUNTER — Encounter: Payer: BC Managed Care – PPO | Attending: Obstetrics & Gynecology | Admitting: Registered"

## 2021-05-19 DIAGNOSIS — O24419 Gestational diabetes mellitus in pregnancy, unspecified control: Secondary | ICD-10-CM

## 2021-05-19 DIAGNOSIS — O99810 Abnormal glucose complicating pregnancy: Secondary | ICD-10-CM | POA: Insufficient documentation

## 2021-05-19 DIAGNOSIS — Z713 Dietary counseling and surveillance: Secondary | ICD-10-CM | POA: Diagnosis not present

## 2021-05-19 DIAGNOSIS — O365930 Maternal care for other known or suspected poor fetal growth, third trimester, not applicable or unspecified: Secondary | ICD-10-CM

## 2021-05-19 DIAGNOSIS — R638 Other symptoms and signs concerning food and fluid intake: Secondary | ICD-10-CM

## 2021-05-19 DIAGNOSIS — O43123 Velamentous insertion of umbilical cord, third trimester: Secondary | ICD-10-CM

## 2021-05-20 ENCOUNTER — Encounter: Payer: Self-pay | Admitting: Registered"

## 2021-05-20 DIAGNOSIS — O24419 Gestational diabetes mellitus in pregnancy, unspecified control: Secondary | ICD-10-CM | POA: Insufficient documentation

## 2021-05-20 NOTE — Progress Notes (Signed)
Patient was seen on 05/19/21 for Gestational Diabetes self-management class at the [facility]. The following learning objectives were met by the patient during this course: ? ?States the definition of Gestational Diabetes ?States why dietary management is important in controlling blood glucose ?Describes the effects each nutrient has on blood glucose levels ?Demonstrates ability to create a balanced meal plan ?Demonstrates carbohydrate counting  ?States when to check blood glucose levels ?Demonstrates proper blood glucose monitoring techniques ?States the effect of stress and exercise on blood glucose levels ?States the importance of limiting caffeine and abstaining from alcohol and smoking ? ?Blood glucose monitor given: Patient brought meter to class and received instruction ?CBG: 90 mg/dL ? ?Patient instructed to monitor glucose levels: ?FBS: 60 - <95; 1 hour: <140; 2 hour: <120 ? ?Patient received handouts: ?Nutrition Diabetes and Pregnancy, including carb counting list ? ?Patient will be seen for follow-up as needed. ?

## 2021-05-21 DIAGNOSIS — R030 Elevated blood-pressure reading, without diagnosis of hypertension: Secondary | ICD-10-CM | POA: Diagnosis not present

## 2021-05-26 ENCOUNTER — Ambulatory Visit (HOSPITAL_BASED_OUTPATIENT_CLINIC_OR_DEPARTMENT_OTHER): Payer: BC Managed Care – PPO | Admitting: *Deleted

## 2021-05-26 ENCOUNTER — Ambulatory Visit: Payer: BC Managed Care – PPO | Admitting: *Deleted

## 2021-05-26 ENCOUNTER — Other Ambulatory Visit: Payer: Self-pay

## 2021-05-26 ENCOUNTER — Ambulatory Visit: Payer: BC Managed Care – PPO | Attending: Maternal & Fetal Medicine

## 2021-05-26 VITALS — BP 145/97 | HR 73

## 2021-05-26 VITALS — BP 132/97

## 2021-05-26 DIAGNOSIS — O365930 Maternal care for other known or suspected poor fetal growth, third trimester, not applicable or unspecified: Secondary | ICD-10-CM

## 2021-05-26 DIAGNOSIS — O43123 Velamentous insertion of umbilical cord, third trimester: Secondary | ICD-10-CM

## 2021-05-26 DIAGNOSIS — Z3A31 31 weeks gestation of pregnancy: Secondary | ICD-10-CM

## 2021-05-26 DIAGNOSIS — O365990 Maternal care for other known or suspected poor fetal growth, unspecified trimester, not applicable or unspecified: Secondary | ICD-10-CM | POA: Diagnosis not present

## 2021-05-26 DIAGNOSIS — E669 Obesity, unspecified: Secondary | ICD-10-CM

## 2021-05-26 DIAGNOSIS — O09512 Supervision of elderly primigravida, second trimester: Secondary | ICD-10-CM | POA: Diagnosis not present

## 2021-05-26 DIAGNOSIS — O133 Gestational [pregnancy-induced] hypertension without significant proteinuria, third trimester: Secondary | ICD-10-CM

## 2021-05-26 DIAGNOSIS — O43122 Velamentous insertion of umbilical cord, second trimester: Secondary | ICD-10-CM | POA: Diagnosis not present

## 2021-05-26 DIAGNOSIS — O99213 Obesity complicating pregnancy, third trimester: Secondary | ICD-10-CM

## 2021-05-26 NOTE — Procedures (Signed)
Ardine Eng ?17-Jul-1985 ?[redacted]w[redacted]d ? ?Fetus A Non-Stress Test Interpretation for 05/26/21 ? ?Indication: IUGR ? ?Fetal Heart Rate A ?Mode: External ?Baseline Rate (A): 140 bpm ?Variability: Moderate ?Accelerations: 15 x 15 ?Decelerations: None ?Multiple birth?: No ? ?Uterine Activity ?Mode: Palpation, Toco ?Contraction Frequency (min): none ?Resting Tone Palpated: Relaxed ? ?Interpretation (Fetal Testing) ?Nonstress Test Interpretation: Reactive ?Overall Impression: Reassuring for gestational age ?Comments: Dr. Gertie Exon reviewed tracing ? ? ?

## 2021-06-03 ENCOUNTER — Ambulatory Visit (HOSPITAL_BASED_OUTPATIENT_CLINIC_OR_DEPARTMENT_OTHER): Payer: BC Managed Care – PPO | Admitting: *Deleted

## 2021-06-03 ENCOUNTER — Ambulatory Visit (HOSPITAL_BASED_OUTPATIENT_CLINIC_OR_DEPARTMENT_OTHER): Payer: BC Managed Care – PPO | Admitting: Maternal & Fetal Medicine

## 2021-06-03 ENCOUNTER — Ambulatory Visit: Payer: BC Managed Care – PPO | Attending: Maternal & Fetal Medicine

## 2021-06-03 ENCOUNTER — Other Ambulatory Visit: Payer: Self-pay

## 2021-06-03 ENCOUNTER — Other Ambulatory Visit: Payer: Self-pay | Admitting: Maternal & Fetal Medicine

## 2021-06-03 ENCOUNTER — Ambulatory Visit: Payer: BC Managed Care – PPO | Admitting: *Deleted

## 2021-06-03 VITALS — BP 136/102 | HR 86

## 2021-06-03 DIAGNOSIS — O43123 Velamentous insertion of umbilical cord, third trimester: Secondary | ICD-10-CM

## 2021-06-03 DIAGNOSIS — Z3A32 32 weeks gestation of pregnancy: Secondary | ICD-10-CM

## 2021-06-03 DIAGNOSIS — O10013 Pre-existing essential hypertension complicating pregnancy, third trimester: Secondary | ICD-10-CM | POA: Diagnosis not present

## 2021-06-03 DIAGNOSIS — O09513 Supervision of elderly primigravida, third trimester: Secondary | ICD-10-CM | POA: Diagnosis not present

## 2021-06-03 DIAGNOSIS — O133 Gestational [pregnancy-induced] hypertension without significant proteinuria, third trimester: Secondary | ICD-10-CM | POA: Insufficient documentation

## 2021-06-03 DIAGNOSIS — O24419 Gestational diabetes mellitus in pregnancy, unspecified control: Secondary | ICD-10-CM | POA: Diagnosis not present

## 2021-06-03 DIAGNOSIS — O24410 Gestational diabetes mellitus in pregnancy, diet controlled: Secondary | ICD-10-CM

## 2021-06-03 DIAGNOSIS — O365931 Maternal care for other known or suspected poor fetal growth, third trimester, fetus 1: Secondary | ICD-10-CM | POA: Diagnosis not present

## 2021-06-03 DIAGNOSIS — R638 Other symptoms and signs concerning food and fluid intake: Secondary | ICD-10-CM | POA: Insufficient documentation

## 2021-06-03 DIAGNOSIS — O09522 Supervision of elderly multigravida, second trimester: Secondary | ICD-10-CM

## 2021-06-03 DIAGNOSIS — E669 Obesity, unspecified: Secondary | ICD-10-CM

## 2021-06-03 DIAGNOSIS — O365930 Maternal care for other known or suspected poor fetal growth, third trimester, not applicable or unspecified: Secondary | ICD-10-CM | POA: Diagnosis not present

## 2021-06-03 DIAGNOSIS — O24415 Gestational diabetes mellitus in pregnancy, controlled by oral hypoglycemic drugs: Secondary | ICD-10-CM

## 2021-06-03 DIAGNOSIS — O99213 Obesity complicating pregnancy, third trimester: Secondary | ICD-10-CM

## 2021-06-03 NOTE — Procedures (Signed)
Jamie Acosta ?December 31, 1985 ?[redacted]w[redacted]d ? ?Fetus A Non-Stress Test Interpretation for 06/03/21 ? ?Indication: IUGR ? ?Fetal Heart Rate A ?Mode: External ?Baseline Rate (A): 140 bpm ?Variability: Moderate ?Accelerations: 15 x 15 ?Decelerations: None ?Multiple birth?: No ? ?Uterine Activity ?Mode: Palpation, Toco ?Contraction Frequency (min): 2 ucs ?Contraction Duration (sec): 90-100 ?Contraction Quality: Mild ?Resting Tone Palpated: Relaxed ?Resting Time: Adequate ? ?Interpretation (Fetal Testing) ?Nonstress Test Interpretation: Reactive ?Overall Impression: Reassuring for gestational age ?Comments: Dr. Grace Bushy reviewed tracing ? ? ?

## 2021-06-03 NOTE — Progress Notes (Signed)
MFM Brief Note ? ?Ms. Jamie Acosta is here for antentatal testing with [facility] requiring treatment, severe fetal growth restriction and velamentous cord insertion. ? ?She is seen again today at the request of Dr. Clance Boll ? ?Biophysical profile 10/10 with good fetal movement and amniotic fluid volume ?UA Dopplers are normal with no evidence of AEDF or REDF  ? ?Today we did observe elevated blood pressures of 143/102, 136/102  She denies s/sx of preeclampsia and reports that her prior labs were normal. ? ?She reports good fetal movement. ? ?I provided [name] s/sx of preeclampsia including headache, vision changes and RUQ pain. ? ?At this time I recommend 2x weekly testing (BPP/NST at our offices) and NST at your offices.  ?Weekly labs. ? ?She is taking Labetalol 200 mg BID- today I recommended she take 200 mg TID as she notes that her afternoon and evening blood pressures are elevated. ? ?We discussed the possibility of inpatient management if her blood pressures continue to rise for a period of observation and blood pressure control. ? ?Again I reiterated the goal of a 37 week delivery, however, given her blood pressure changes etc there is an increased risk for a delivery between 34-36 weeks.  ? ?I spent 20 minutes with > 50% in face to face consulation. ? ?Novella Olive, MD ?

## 2021-06-04 ENCOUNTER — Other Ambulatory Visit: Payer: Self-pay | Admitting: *Deleted

## 2021-06-04 ENCOUNTER — Ambulatory Visit: Payer: BC Managed Care – PPO

## 2021-06-04 DIAGNOSIS — O10913 Unspecified pre-existing hypertension complicating pregnancy, third trimester: Secondary | ICD-10-CM

## 2021-06-04 DIAGNOSIS — O43123 Velamentous insertion of umbilical cord, third trimester: Secondary | ICD-10-CM

## 2021-06-04 DIAGNOSIS — O365930 Maternal care for other known or suspected poor fetal growth, third trimester, not applicable or unspecified: Secondary | ICD-10-CM

## 2021-06-04 DIAGNOSIS — O09513 Supervision of elderly primigravida, third trimester: Secondary | ICD-10-CM

## 2021-06-04 DIAGNOSIS — O24415 Gestational diabetes mellitus in pregnancy, controlled by oral hypoglycemic drugs: Secondary | ICD-10-CM

## 2021-06-08 ENCOUNTER — Other Ambulatory Visit: Payer: Self-pay | Admitting: Maternal & Fetal Medicine

## 2021-06-08 DIAGNOSIS — O365930 Maternal care for other known or suspected poor fetal growth, third trimester, not applicable or unspecified: Secondary | ICD-10-CM

## 2021-06-08 DIAGNOSIS — O43123 Velamentous insertion of umbilical cord, third trimester: Secondary | ICD-10-CM

## 2021-06-08 DIAGNOSIS — O24419 Gestational diabetes mellitus in pregnancy, unspecified control: Secondary | ICD-10-CM

## 2021-06-08 DIAGNOSIS — R638 Other symptoms and signs concerning food and fluid intake: Secondary | ICD-10-CM

## 2021-06-09 ENCOUNTER — Ambulatory Visit: Payer: BC Managed Care – PPO | Attending: Maternal & Fetal Medicine

## 2021-06-09 ENCOUNTER — Other Ambulatory Visit: Payer: Self-pay

## 2021-06-09 ENCOUNTER — Ambulatory Visit: Payer: BC Managed Care – PPO

## 2021-06-09 ENCOUNTER — Ambulatory Visit: Payer: BC Managed Care – PPO | Admitting: *Deleted

## 2021-06-09 VITALS — BP 131/98 | HR 78

## 2021-06-09 DIAGNOSIS — O24415 Gestational diabetes mellitus in pregnancy, controlled by oral hypoglycemic drugs: Secondary | ICD-10-CM

## 2021-06-09 DIAGNOSIS — O133 Gestational [pregnancy-induced] hypertension without significant proteinuria, third trimester: Secondary | ICD-10-CM | POA: Insufficient documentation

## 2021-06-09 DIAGNOSIS — O24419 Gestational diabetes mellitus in pregnancy, unspecified control: Secondary | ICD-10-CM | POA: Diagnosis not present

## 2021-06-09 DIAGNOSIS — R638 Other symptoms and signs concerning food and fluid intake: Secondary | ICD-10-CM | POA: Diagnosis not present

## 2021-06-09 DIAGNOSIS — Z23 Encounter for immunization: Secondary | ICD-10-CM | POA: Diagnosis not present

## 2021-06-09 DIAGNOSIS — O09513 Supervision of elderly primigravida, third trimester: Secondary | ICD-10-CM

## 2021-06-09 DIAGNOSIS — Z3A33 33 weeks gestation of pregnancy: Secondary | ICD-10-CM

## 2021-06-09 DIAGNOSIS — O99213 Obesity complicating pregnancy, third trimester: Secondary | ICD-10-CM | POA: Diagnosis not present

## 2021-06-09 DIAGNOSIS — O43123 Velamentous insertion of umbilical cord, third trimester: Secondary | ICD-10-CM | POA: Insufficient documentation

## 2021-06-09 DIAGNOSIS — O365930 Maternal care for other known or suspected poor fetal growth, third trimester, not applicable or unspecified: Secondary | ICD-10-CM | POA: Diagnosis not present

## 2021-06-09 DIAGNOSIS — E669 Obesity, unspecified: Secondary | ICD-10-CM

## 2021-06-09 DIAGNOSIS — Z3A32 32 weeks gestation of pregnancy: Secondary | ICD-10-CM | POA: Diagnosis not present

## 2021-06-09 DIAGNOSIS — O139 Gestational [pregnancy-induced] hypertension without significant proteinuria, unspecified trimester: Secondary | ICD-10-CM | POA: Diagnosis not present

## 2021-06-09 NOTE — Procedures (Signed)
Jamie Acosta ?Aug 06, 1985 ?[redacted]w[redacted]d ? ?Fetus A Non-Stress Test Interpretation for 06/09/21 ? ?Indication: IUGR ? ?Fetal Heart Rate A ?Mode: External ?Baseline Rate (A): 130 bpm ?Variability: Moderate ?Accelerations: 15 x 15 ?Decelerations: None ?Multiple birth?: No ? ?Uterine Activity ?Mode: Palpation, Toco ?Contraction Frequency (min): none ?Resting Tone Palpated: Relaxed ? ?Interpretation (Fetal Testing) ?Nonstress Test Interpretation: Reactive ?Overall Impression: Reassuring for gestational age ?Comments: Dr. Judeth Cornfield reviewed tracing ? ? ?

## 2021-06-11 ENCOUNTER — Ambulatory Visit: Payer: BC Managed Care – PPO

## 2021-06-14 ENCOUNTER — Other Ambulatory Visit: Payer: Self-pay | Admitting: Maternal & Fetal Medicine

## 2021-06-14 DIAGNOSIS — R638 Other symptoms and signs concerning food and fluid intake: Secondary | ICD-10-CM

## 2021-06-14 DIAGNOSIS — O43123 Velamentous insertion of umbilical cord, third trimester: Secondary | ICD-10-CM

## 2021-06-14 DIAGNOSIS — O24419 Gestational diabetes mellitus in pregnancy, unspecified control: Secondary | ICD-10-CM

## 2021-06-14 DIAGNOSIS — O365930 Maternal care for other known or suspected poor fetal growth, third trimester, not applicable or unspecified: Secondary | ICD-10-CM

## 2021-06-16 DIAGNOSIS — O365930 Maternal care for other known or suspected poor fetal growth, third trimester, not applicable or unspecified: Secondary | ICD-10-CM | POA: Diagnosis not present

## 2021-06-16 DIAGNOSIS — Z3A34 34 weeks gestation of pregnancy: Secondary | ICD-10-CM | POA: Diagnosis not present

## 2021-06-16 DIAGNOSIS — O133 Gestational [pregnancy-induced] hypertension without significant proteinuria, third trimester: Secondary | ICD-10-CM | POA: Diagnosis not present

## 2021-06-16 DIAGNOSIS — O139 Gestational [pregnancy-induced] hypertension without significant proteinuria, unspecified trimester: Secondary | ICD-10-CM | POA: Diagnosis not present

## 2021-06-18 ENCOUNTER — Ambulatory Visit: Payer: BC Managed Care – PPO | Admitting: *Deleted

## 2021-06-18 ENCOUNTER — Other Ambulatory Visit: Payer: Self-pay | Admitting: *Deleted

## 2021-06-18 ENCOUNTER — Ambulatory Visit: Payer: BC Managed Care – PPO | Attending: Maternal & Fetal Medicine

## 2021-06-18 VITALS — BP 136/105 | HR 68

## 2021-06-18 DIAGNOSIS — O10013 Pre-existing essential hypertension complicating pregnancy, third trimester: Secondary | ICD-10-CM | POA: Diagnosis not present

## 2021-06-18 DIAGNOSIS — O99213 Obesity complicating pregnancy, third trimester: Secondary | ICD-10-CM

## 2021-06-18 DIAGNOSIS — E669 Obesity, unspecified: Secondary | ICD-10-CM

## 2021-06-18 DIAGNOSIS — O43123 Velamentous insertion of umbilical cord, third trimester: Secondary | ICD-10-CM | POA: Insufficient documentation

## 2021-06-18 DIAGNOSIS — O10913 Unspecified pre-existing hypertension complicating pregnancy, third trimester: Secondary | ICD-10-CM | POA: Diagnosis not present

## 2021-06-18 DIAGNOSIS — O365931 Maternal care for other known or suspected poor fetal growth, third trimester, fetus 1: Secondary | ICD-10-CM

## 2021-06-18 DIAGNOSIS — O24415 Gestational diabetes mellitus in pregnancy, controlled by oral hypoglycemic drugs: Secondary | ICD-10-CM | POA: Diagnosis not present

## 2021-06-18 DIAGNOSIS — O09513 Supervision of elderly primigravida, third trimester: Secondary | ICD-10-CM | POA: Insufficient documentation

## 2021-06-18 DIAGNOSIS — Z3A34 34 weeks gestation of pregnancy: Secondary | ICD-10-CM

## 2021-06-18 DIAGNOSIS — O365930 Maternal care for other known or suspected poor fetal growth, third trimester, not applicable or unspecified: Secondary | ICD-10-CM

## 2021-06-18 NOTE — Procedures (Signed)
Jamie Acosta ?07/11/1985 ?[redacted]w[redacted]d ? ?Fetus A Non-Stress Test Interpretation for 06/18/21 ? ?Indication: IUGR ? ?Fetal Heart Rate A ?Mode: External ?Baseline Rate (A): 130 bpm ?Variability: Moderate ?Accelerations: 15 x 15 ?Decelerations: None ?Multiple birth?: No ? ?Uterine Activity ?Mode: Palpation, Toco ?Contraction Frequency (min): none ?Resting Tone Palpated: Relaxed ? ?Interpretation (Fetal Testing) ?Nonstress Test Interpretation: Reactive ?Overall Impression: Reassuring for gestational age ?Comments: Dr. Parke Poisson reviewed tracing ? ? ?

## 2021-06-22 ENCOUNTER — Ambulatory Visit: Payer: BC Managed Care – PPO | Attending: Maternal & Fetal Medicine | Admitting: *Deleted

## 2021-06-22 ENCOUNTER — Ambulatory Visit (HOSPITAL_BASED_OUTPATIENT_CLINIC_OR_DEPARTMENT_OTHER): Payer: BC Managed Care – PPO | Admitting: *Deleted

## 2021-06-22 ENCOUNTER — Ambulatory Visit (HOSPITAL_BASED_OUTPATIENT_CLINIC_OR_DEPARTMENT_OTHER): Payer: BC Managed Care – PPO

## 2021-06-22 VITALS — BP 129/92 | HR 76

## 2021-06-22 DIAGNOSIS — O09523 Supervision of elderly multigravida, third trimester: Secondary | ICD-10-CM | POA: Diagnosis not present

## 2021-06-22 DIAGNOSIS — O24415 Gestational diabetes mellitus in pregnancy, controlled by oral hypoglycemic drugs: Secondary | ICD-10-CM | POA: Diagnosis not present

## 2021-06-22 DIAGNOSIS — O43123 Velamentous insertion of umbilical cord, third trimester: Secondary | ICD-10-CM | POA: Diagnosis not present

## 2021-06-22 DIAGNOSIS — O10013 Pre-existing essential hypertension complicating pregnancy, third trimester: Secondary | ICD-10-CM | POA: Diagnosis not present

## 2021-06-22 DIAGNOSIS — O99213 Obesity complicating pregnancy, third trimester: Secondary | ICD-10-CM | POA: Diagnosis not present

## 2021-06-22 DIAGNOSIS — O365930 Maternal care for other known or suspected poor fetal growth, third trimester, not applicable or unspecified: Secondary | ICD-10-CM

## 2021-06-22 DIAGNOSIS — O09513 Supervision of elderly primigravida, third trimester: Secondary | ICD-10-CM

## 2021-06-22 DIAGNOSIS — O10913 Unspecified pre-existing hypertension complicating pregnancy, third trimester: Secondary | ICD-10-CM | POA: Diagnosis not present

## 2021-06-22 DIAGNOSIS — Z3A34 34 weeks gestation of pregnancy: Secondary | ICD-10-CM | POA: Insufficient documentation

## 2021-06-22 DIAGNOSIS — O4103X0 Oligohydramnios, third trimester, not applicable or unspecified: Secondary | ICD-10-CM | POA: Insufficient documentation

## 2021-06-22 DIAGNOSIS — E669 Obesity, unspecified: Secondary | ICD-10-CM | POA: Insufficient documentation

## 2021-06-22 DIAGNOSIS — Z364 Encounter for antenatal screening for fetal growth retardation: Secondary | ICD-10-CM | POA: Insufficient documentation

## 2021-06-22 NOTE — Procedures (Signed)
Ardine Eng ?[birth year] ?[redacted]w[redacted]d ? ?Fetus A Non-Stress Test Interpretation for 06/22/21 ? ?Indication: IUGR ? ?Fetal Heart Rate A ?Mode: External ?Baseline Rate (A): 130 bpm ?Variability: Moderate ?Accelerations: 15 x 15 ?Decelerations: None ?Multiple birth?: No ? ?Uterine Activity ?Mode: Palpation, Toco ?Contraction Frequency (min): UI ?Contraction Quality: Mild ?Resting Time: Adequate ? ?Interpretation (Fetal Testing) ?Nonstress Test Interpretation: Reactive ?Comments: Tracing reviewed by Dr. Gertie Exon ? ? ?

## 2021-06-23 ENCOUNTER — Ambulatory Visit: Payer: BC Managed Care – PPO

## 2021-06-24 DIAGNOSIS — Z3A35 35 weeks gestation of pregnancy: Secondary | ICD-10-CM | POA: Diagnosis not present

## 2021-06-24 DIAGNOSIS — O24415 Gestational diabetes mellitus in pregnancy, controlled by oral hypoglycemic drugs: Secondary | ICD-10-CM | POA: Diagnosis not present

## 2021-06-24 DIAGNOSIS — Z3685 Encounter for antenatal screening for Streptococcus B: Secondary | ICD-10-CM | POA: Diagnosis not present

## 2021-06-24 DIAGNOSIS — O139 Gestational [pregnancy-induced] hypertension without significant proteinuria, unspecified trimester: Secondary | ICD-10-CM | POA: Diagnosis not present

## 2021-06-24 DIAGNOSIS — O133 Gestational [pregnancy-induced] hypertension without significant proteinuria, third trimester: Secondary | ICD-10-CM | POA: Diagnosis not present

## 2021-06-24 LAB — OB RESULTS CONSOLE GBS: GBS: NEGATIVE

## 2021-06-28 DIAGNOSIS — O139 Gestational [pregnancy-induced] hypertension without significant proteinuria, unspecified trimester: Secondary | ICD-10-CM | POA: Diagnosis not present

## 2021-06-28 DIAGNOSIS — O365930 Maternal care for other known or suspected poor fetal growth, third trimester, not applicable or unspecified: Secondary | ICD-10-CM | POA: Diagnosis not present

## 2021-06-28 DIAGNOSIS — O133 Gestational [pregnancy-induced] hypertension without significant proteinuria, third trimester: Secondary | ICD-10-CM | POA: Diagnosis not present

## 2021-06-28 DIAGNOSIS — Z3A35 35 weeks gestation of pregnancy: Secondary | ICD-10-CM | POA: Diagnosis not present

## 2021-06-29 ENCOUNTER — Telehealth (HOSPITAL_COMMUNITY): Payer: Self-pay | Admitting: *Deleted

## 2021-06-29 DIAGNOSIS — L299 Pruritus, unspecified: Secondary | ICD-10-CM | POA: Diagnosis not present

## 2021-06-30 ENCOUNTER — Telehealth (HOSPITAL_COMMUNITY): Payer: Self-pay | Admitting: *Deleted

## 2021-06-30 NOTE — Telephone Encounter (Signed)
Preadmission screen  

## 2021-07-01 ENCOUNTER — Telehealth (HOSPITAL_COMMUNITY): Payer: Self-pay | Admitting: *Deleted

## 2021-07-01 ENCOUNTER — Ambulatory Visit: Payer: BC Managed Care – PPO | Attending: Obstetrics and Gynecology | Admitting: Obstetrics and Gynecology

## 2021-07-01 ENCOUNTER — Ambulatory Visit: Payer: BC Managed Care – PPO | Attending: Maternal & Fetal Medicine

## 2021-07-01 ENCOUNTER — Encounter (HOSPITAL_COMMUNITY): Payer: Self-pay | Admitting: *Deleted

## 2021-07-01 ENCOUNTER — Ambulatory Visit: Payer: BC Managed Care – PPO | Admitting: *Deleted

## 2021-07-01 ENCOUNTER — Ambulatory Visit: Payer: BC Managed Care – PPO

## 2021-07-01 VITALS — BP 143/111 | HR 67

## 2021-07-01 VITALS — BP 148/99

## 2021-07-01 DIAGNOSIS — O365930 Maternal care for other known or suspected poor fetal growth, third trimester, not applicable or unspecified: Secondary | ICD-10-CM

## 2021-07-01 DIAGNOSIS — O133 Gestational [pregnancy-induced] hypertension without significant proteinuria, third trimester: Secondary | ICD-10-CM

## 2021-07-01 DIAGNOSIS — O24419 Gestational diabetes mellitus in pregnancy, unspecified control: Secondary | ICD-10-CM

## 2021-07-01 DIAGNOSIS — O99213 Obesity complicating pregnancy, third trimester: Secondary | ICD-10-CM

## 2021-07-01 DIAGNOSIS — Z3A36 36 weeks gestation of pregnancy: Secondary | ICD-10-CM

## 2021-07-01 DIAGNOSIS — O43123 Velamentous insertion of umbilical cord, third trimester: Secondary | ICD-10-CM | POA: Diagnosis not present

## 2021-07-01 DIAGNOSIS — O24415 Gestational diabetes mellitus in pregnancy, controlled by oral hypoglycemic drugs: Secondary | ICD-10-CM | POA: Insufficient documentation

## 2021-07-01 DIAGNOSIS — O10913 Unspecified pre-existing hypertension complicating pregnancy, third trimester: Secondary | ICD-10-CM | POA: Insufficient documentation

## 2021-07-01 DIAGNOSIS — O09513 Supervision of elderly primigravida, third trimester: Secondary | ICD-10-CM | POA: Insufficient documentation

## 2021-07-01 NOTE — Telephone Encounter (Signed)
Preadmission screen  

## 2021-07-01 NOTE — Procedures (Signed)
Jamie Acosta ?Feb 04, 1986 ?[redacted]w[redacted]d ? ?Fetus A Non-Stress Test Interpretation for 07/01/21 ? ?Indication: Chronic Hypertenstion ? ?Fetal Heart Rate A ?Mode: External ?Baseline Rate (A): 130 bpm ?Variability: Moderate ?Accelerations: 15 x 15 ?Decelerations: None ?Multiple birth?: No ? ?Uterine Activity ?Mode: Palpation, Toco ?Contraction Frequency (min): none ?Resting Tone Palpated: Relaxed ? ?Interpretation (Fetal Testing) ?Nonstress Test Interpretation: Reactive ?Overall Impression: Reassuring for gestational age ?Comments: Dr. Judeth Cornfield reviewed tracing ? ? ?

## 2021-07-01 NOTE — Progress Notes (Signed)
Dr. Judeth Cornfield aware of elevated BP's-plan of care/ recommendations discussed with patient and husband. ?

## 2021-07-01 NOTE — Progress Notes (Signed)
Maternal-Fetal Medicine  ? ?Name: Jamie Acosta ?DOB: 08-Feb-1986 ?MRN: [id number] ?Referring Provider: [facility] ? ?I had the pleasure of seeing Ms. Jamie Ac[facility] today at [facility]. She is G1 P0 at 36w 1d gestation and is here for fetal growth assessment and antenatal testing.  She was accompanied by her husband. ?Her pregnancy is complicated by gestational hypertension and severe fetal growth restriction. She takes labetalol. ?She is scheduled to undergo induction of labor on 07/10/2021. ? ?Blood pressures today at our office are 143/110, 143/111 and 148/99 mmHg.  ? ?Ultrasound ?On today's ultrasound, the estimated fetal weight is at the 4th percentile.  Interval weight gain is 621 g.  Abdominal circumference measurement is at the 1st percentile.  Amniotic fluid is normal and good fetal activity seen umbilical artery Doppler showed normal forward diastolic flow.  NST is reactive.  BPP 10/10 ? ?Gestational hypertension with fetal growth restriction ?I discussed the significance of severe range blood pressures recorded at our office.  I counseled her on the possible complications of gestational hypertension including eclampsia, pulmonary edema and endorgan damage, coagulation disturbances and placental abruption. ?Patient reports that her blood pressures have been normal to mild hypertensive range at home.  She does not have signs symptoms of severe headache or visual disturbances or right upper quadrant pain or vaginal bleeding. ?I discussed the following options: a) delivery now because of severe range blood pressures and a fetal growth restriction, b) MAU evaluation for series of blood pressures to enable her provider to make decision. ?She would like to check her blood pressures at home and does not want to go to the MAU now. ?I counseled her on the parameters of blood pressures and if severe range blood pressures i.e. systolic blood pressures at 160 or greater and/or diastolic blood  pressures at 110 or greater, she should go to [facility]. ? ?I discussed the findings and recommendations with Dr. Lanny Cramp. ? ?Recommendations ?Delivery is indicated: ?-Recommended delivery now (patient opted not to be delivered now). ?- If she has severe hypertension or has symptoms of severe features of preeclampsia. ?-If increase in antihypertensive dosage is necessary. ? ?Thank you for consultation.  If you have any questions or concerns, please contact me the [facility].  Consultation including face-to-face (more than 50%) counseling 30 minutes. ? ?

## 2021-07-05 ENCOUNTER — Telehealth (HOSPITAL_COMMUNITY): Payer: Self-pay | Admitting: *Deleted

## 2021-07-05 ENCOUNTER — Ambulatory Visit: Payer: BC Managed Care – PPO | Attending: Obstetrics

## 2021-07-05 ENCOUNTER — Other Ambulatory Visit: Payer: Self-pay | Admitting: Obstetrics

## 2021-07-05 ENCOUNTER — Ambulatory Visit: Payer: BC Managed Care – PPO | Admitting: *Deleted

## 2021-07-05 VITALS — BP 142/107 | HR 74

## 2021-07-05 DIAGNOSIS — O365930 Maternal care for other known or suspected poor fetal growth, third trimester, not applicable or unspecified: Secondary | ICD-10-CM

## 2021-07-05 DIAGNOSIS — O43123 Velamentous insertion of umbilical cord, third trimester: Secondary | ICD-10-CM

## 2021-07-05 DIAGNOSIS — O133 Gestational [pregnancy-induced] hypertension without significant proteinuria, third trimester: Secondary | ICD-10-CM | POA: Diagnosis not present

## 2021-07-05 DIAGNOSIS — E669 Obesity, unspecified: Secondary | ICD-10-CM

## 2021-07-05 DIAGNOSIS — O365931 Maternal care for other known or suspected poor fetal growth, third trimester, fetus 1: Secondary | ICD-10-CM

## 2021-07-05 DIAGNOSIS — O24415 Gestational diabetes mellitus in pregnancy, controlled by oral hypoglycemic drugs: Secondary | ICD-10-CM

## 2021-07-05 DIAGNOSIS — O09513 Supervision of elderly primigravida, third trimester: Secondary | ICD-10-CM

## 2021-07-05 DIAGNOSIS — O10913 Unspecified pre-existing hypertension complicating pregnancy, third trimester: Secondary | ICD-10-CM | POA: Insufficient documentation

## 2021-07-05 DIAGNOSIS — O99213 Obesity complicating pregnancy, third trimester: Secondary | ICD-10-CM

## 2021-07-05 DIAGNOSIS — Z3A36 36 weeks gestation of pregnancy: Secondary | ICD-10-CM

## 2021-07-05 NOTE — Procedures (Signed)
Jamie Acosta ?07-20-1985 ?[redacted]w[redacted]d ? ?Fetus A Non-Stress Test Interpretation for 07/05/21 ? ?Indication: Gestational Diabetes medication controlled ? ?Fetal Heart Rate A ?Mode: External ?Baseline Rate (A): 130 bpm ?Variability: Moderate ?Accelerations: 15 x 15 ?Decelerations: None ?Multiple birth?: No ? ?Uterine Activity ?Mode: Toco ?Contraction Frequency (min): 1 u//c in 25 min ?Contraction Duration (sec): 70 ?Contraction Quality: Mild (not felt by pt) ?Resting Tone Palpated: Relaxed ? ?Interpretation (Fetal Testing) ?Nonstress Test Interpretation: Reactive ?Overall Impression: Reassuring for gestational age ?Comments: tracing reviewed by Dr. Parke Poisson ? ? ?

## 2021-07-05 NOTE — Telephone Encounter (Signed)
Preadmission screen  

## 2021-07-07 ENCOUNTER — Other Ambulatory Visit: Payer: Self-pay | Admitting: Obstetrics and Gynecology

## 2021-07-08 DIAGNOSIS — O133 Gestational [pregnancy-induced] hypertension without significant proteinuria, third trimester: Secondary | ICD-10-CM | POA: Diagnosis not present

## 2021-07-08 DIAGNOSIS — Z3A37 37 weeks gestation of pregnancy: Secondary | ICD-10-CM | POA: Diagnosis not present

## 2021-07-10 ENCOUNTER — Encounter (HOSPITAL_COMMUNITY): Payer: Self-pay | Admitting: Obstetrics and Gynecology

## 2021-07-10 ENCOUNTER — Inpatient Hospital Stay (HOSPITAL_COMMUNITY)
Admission: AD | Admit: 2021-07-10 | Discharge: 2021-07-13 | DRG: 807 | Disposition: A | Discharge status: Home / Self Care | Payer: BC Managed Care – PPO | Attending: Obstetrics and Gynecology | Admitting: Obstetrics and Gynecology

## 2021-07-10 ENCOUNTER — Inpatient Hospital Stay (HOSPITAL_COMMUNITY): Payer: BC Managed Care – PPO

## 2021-07-10 DIAGNOSIS — O09519 Supervision of elderly primigravida, unspecified trimester: Secondary | ICD-10-CM

## 2021-07-10 DIAGNOSIS — O133 Gestational [pregnancy-induced] hypertension without significant proteinuria, third trimester: Secondary | ICD-10-CM | POA: Diagnosis not present

## 2021-07-10 DIAGNOSIS — O43123 Velamentous insertion of umbilical cord, third trimester: Secondary | ICD-10-CM | POA: Diagnosis present

## 2021-07-10 DIAGNOSIS — O134 Gestational [pregnancy-induced] hypertension without significant proteinuria, complicating childbirth: Secondary | ICD-10-CM | POA: Diagnosis not present

## 2021-07-10 DIAGNOSIS — O24425 Gestational diabetes mellitus in childbirth, controlled by oral hypoglycemic drugs: Secondary | ICD-10-CM | POA: Diagnosis not present

## 2021-07-10 DIAGNOSIS — Z3A00 Weeks of gestation of pregnancy not specified: Secondary | ICD-10-CM | POA: Diagnosis not present

## 2021-07-10 DIAGNOSIS — O43193 Other malformation of placenta, third trimester: Secondary | ICD-10-CM | POA: Diagnosis not present

## 2021-07-10 DIAGNOSIS — O24415 Gestational diabetes mellitus in pregnancy, controlled by oral hypoglycemic drugs: Secondary | ICD-10-CM | POA: Diagnosis not present

## 2021-07-10 DIAGNOSIS — O43129 Velamentous insertion of umbilical cord, unspecified trimester: Secondary | ICD-10-CM | POA: Diagnosis present

## 2021-07-10 DIAGNOSIS — O24419 Gestational diabetes mellitus in pregnancy, unspecified control: Secondary | ICD-10-CM

## 2021-07-10 DIAGNOSIS — Z3A37 37 weeks gestation of pregnancy: Secondary | ICD-10-CM

## 2021-07-10 DIAGNOSIS — O09513 Supervision of elderly primigravida, third trimester: Secondary | ICD-10-CM | POA: Diagnosis not present

## 2021-07-10 DIAGNOSIS — Z412 Encounter for routine and ritual male circumcision: Secondary | ICD-10-CM | POA: Diagnosis not present

## 2021-07-10 DIAGNOSIS — O365930 Maternal care for other known or suspected poor fetal growth, third trimester, not applicable or unspecified: Principal | ICD-10-CM | POA: Diagnosis present

## 2021-07-10 DIAGNOSIS — O139 Gestational [pregnancy-induced] hypertension without significant proteinuria, unspecified trimester: Secondary | ICD-10-CM | POA: Diagnosis present

## 2021-07-10 DIAGNOSIS — Z3490 Encounter for supervision of normal pregnancy, unspecified, unspecified trimester: Secondary | ICD-10-CM | POA: Diagnosis present

## 2021-07-10 DIAGNOSIS — O43813 Placental infarction, third trimester: Secondary | ICD-10-CM | POA: Diagnosis not present

## 2021-07-10 LAB — COMPREHENSIVE METABOLIC PANEL WITH GFR
ALT: 19 U/L (ref 0–44)
AST: 24 U/L (ref 15–41)
Albumin: 3.1 g/dL — ABNORMAL LOW (ref 3.5–5.0)
Alkaline Phosphatase: 91 U/L (ref 38–126)
Anion gap: 9 (ref 5–15)
BUN: 9 mg/dL (ref 6–20)
CO2: 22 mmol/L (ref 22–32)
Calcium: 9.6 mg/dL (ref 8.9–10.3)
Chloride: 105 mmol/L (ref 98–111)
Creatinine, Ser: 0.66 mg/dL (ref 0.44–1.00)
GFR, Estimated: >60 mL/min (ref >60)
Glucose, Bld: 82 mg/dL (ref 70–99)
Potassium: 4.3 mmol/L (ref 3.5–5.1)
Sodium: 136 mmol/L (ref 135–145)
Total Bilirubin: 0.6 mg/dL (ref 0.3–1.2)
Total Protein: 5.7 g/dL — ABNORMAL LOW (ref 6.5–8.1)

## 2021-07-10 LAB — TYPE AND SCREEN
ABO/RH(D): A POS
Antibody Screen: NEGATIVE

## 2021-07-10 LAB — CBC
HCT: 39 % (ref 36.0–46.0)
Hemoglobin: 13.5 g/dL (ref 12.0–15.0)
MCH: 30.9 pg (ref 26.0–34.0)
MCHC: 34.6 g/dL (ref 30.0–36.0)
MCV: 89.2 fL (ref 80.0–100.0)
Platelets: 159 10*3/uL (ref 150–400)
RBC: 4.37 MIL/uL (ref 3.87–5.11)
RDW: 13.2 % (ref 11.5–15.5)
WBC: 7.9 10*3/uL (ref 4.0–10.5)
nRBC: 0 % (ref 0.0–0.2)

## 2021-07-10 LAB — GLUCOSE, CAPILLARY
Glucose-Capillary: 109 mg/dL — ABNORMAL HIGH (ref 70–99)
Glucose-Capillary: 93 mg/dL (ref 70–99)

## 2021-07-10 LAB — RPR: RPR Ser Ql: NONREACTIVE

## 2021-07-10 MED ORDER — LACTATED RINGERS IV SOLN
500.0000 mL | INTRAVENOUS | Status: DC | PRN
  Administered 2021-07-11 (×2): 250 mL via INTRAVENOUS

## 2021-07-10 MED ORDER — OXYTOCIN BOLUS FROM INFUSION
333.0000 mL | Freq: Once | INTRAVENOUS | Status: AC
  Administered 2021-07-11 (×2): 333 mL via INTRAVENOUS

## 2021-07-10 MED ORDER — TERBUTALINE SULFATE 1 MG/ML IJ SOLN: 0.2500 mg | Freq: Once | INTRAMUSCULAR | Status: DC | PRN

## 2021-07-10 MED ORDER — OXYTOCIN-SODIUM CHLORIDE 30-0.9 UT/500ML-% IV SOLN
1.0000 m[IU]/min | INTRAVENOUS | Status: DC
  Administered 2021-07-10: 2 m[IU]/min via INTRAVENOUS

## 2021-07-10 MED ORDER — LABETALOL HCL 200 MG PO TABS
200.0000 mg | ORAL_TABLET | Freq: Three times a day (TID) | ORAL | Status: DC
  Administered 2021-07-10 – 2021-07-11 (×3): 200 mg via ORAL
  Filled 2021-07-10 (×3): qty 1

## 2021-07-10 MED ORDER — ONDANSETRON HCL 4 MG/2ML IJ SOLN: 4.0000 mg | Freq: Four times a day (QID) | INTRAMUSCULAR | Status: DC | PRN

## 2021-07-10 MED ORDER — INSULIN ASPART 100 UNIT/ML IJ SOLN
0.0000 [IU] | INTRAMUSCULAR | Status: DC
  Administered 2021-07-11: 2 [IU] via SUBCUTANEOUS

## 2021-07-10 MED ORDER — ACETAMINOPHEN 325 MG PO TABS: 650.0000 mg | ORAL_TABLET | ORAL | Status: DC | PRN

## 2021-07-10 MED ORDER — INSULIN ASPART 100 UNIT/ML IJ SOLN: 0.0000 [IU] | INTRAMUSCULAR | Status: DC

## 2021-07-10 MED ORDER — MISOPROSTOL 25 MCG QUARTER TABLET
25.0000 ug | ORAL_TABLET | ORAL | Status: DC
  Administered 2021-07-10: 25 ug via ORAL

## 2021-07-10 MED ORDER — LACTATED RINGERS IV SOLN
INTRAVENOUS | Status: DC
Start: 2021-07-10 — End: 2021-07-11

## 2021-07-10 MED ORDER — LIDOCAINE HCL (PF) 1 % IJ SOLN
30.0000 mL | INTRAMUSCULAR | Status: AC | PRN
Start: 2021-07-10 — End: 2021-07-11
  Administered 2021-07-11: 30 mL via SUBCUTANEOUS
  Filled 2021-07-10: qty 30

## 2021-07-10 MED ORDER — TERBUTALINE SULFATE 1 MG/ML IJ SOLN
0.2500 mg | Freq: Once | INTRAMUSCULAR | Status: DC | PRN
Start: 2021-07-10 — End: 2021-07-11

## 2021-07-10 MED ORDER — SOD CITRATE-CITRIC ACID 500-334 MG/5ML PO SOLN
30.0000 mL | ORAL | Status: DC | PRN
Start: 2021-07-10 — End: 2021-07-11

## 2021-07-10 MED ORDER — MISOPROSTOL 25 MCG QUARTER TABLET
25.0000 ug | ORAL_TABLET | ORAL | Status: DC | PRN
Start: 2021-07-10 — End: 2021-07-11
  Administered 2021-07-10: 25 ug via VAGINAL
  Filled 2021-07-10 (×2): qty 1

## 2021-07-10 MED ORDER — FENTANYL CITRATE (PF) 100 MCG/2ML IJ SOLN
100.0000 ug | INTRAMUSCULAR | Status: DC | PRN
Start: 2021-07-10 — End: 2021-07-11

## 2021-07-10 MED ORDER — OXYTOCIN-SODIUM CHLORIDE 30-0.9 UT/500ML-% IV SOLN
2.5000 [IU]/h | INTRAVENOUS | Status: DC
Start: 2021-07-10 — End: 2021-07-11
  Filled 2021-07-10: qty 500

## 2021-07-10 NOTE — Progress Notes (Signed)
Jamie Acosta ?16-Sep-1985 ?[id number] ? ?Labor Progress Note ? ?S/O: Pt doing well, feeling some occasional tightening but no pain ? ?Vitals:  ? 07/10/21 1342 07/10/21 1415  ?BP: 124/82 133/83  ?Pulse: 65 67  ? ?SVE: 1/50/03 soft ? ?EFM: cat I baseline 130 bpm mod var +accels, -decels ?Toco: irregular ctxs q 3-5 min ? ?A/P: 37Y G1P0 @ 37.3 IOL IUGR, gHTN, GDMA2 ?GBS-/Rh+/RI ? ?-IOL: s/p 1st dose of vaginal cytotec now contracting but mild and minimal cervical change. Pt consented for cervical Foley balloon, placed at bedside well tolerated by patient- will assess ctx pattern over next 20-30 min, if ctxs remain irregular plan for 2nd dose of cytotec given buccally  ?-Cont EFM/Toco- cat I ?-Pt may have IV pain medications PRN, epidural when requested ?-IUGR- reassuring fetal status ?-gHTN: reviewed admission PIH labs WNL, BP well controlled on PO Labetalol regimen, cont to monitor ?-GDMA2 last FSG 82, cont q4hr monitoring w/ ISS coverage per protocol ?-routine intrapartum care ?-anticipate SVD ? ?Jamie Acosta ?07/10/21 ?3:37 PM ? ?

## 2021-07-10 NOTE — Progress Notes (Signed)
Jamie Acosta ?1985/06/05 ?[id number] ? ?Labor Progress Note ? ?S/O: Pt doing well sitting comfortably in bed. Feels more ctxs now but still comfortable ? ?Vitals:  ? 07/10/21 2201 07/10/21 2231  ?BP: (!) 145/105 (!) 145/107  ?Pulse: 66 67  ?Temp:    ? ?SVE: 3/50/-2 ? ?EFM: Cat I baseline 135 bpm mod var +accels, - decels ?Toco: ctxs q 1- 4 min ? ?A/P: 35Y G1P0 @ 37.3 IOL IUGR, gHTN, GDMA2 ?GBS-/Rh+/RI ?  ?-IOL: s/p 2 doses of cytotec and cervical Foley balloon, now on Pitocin of 85mU. Patient consented for AROM and performed at 2240 for clear fluid. Will continue to titrate Pitocin by 2's per protocol ?-Cont EFM/Toco- cat I ?-Plan for epidural on request ?-IUGR- reassuring fetal status ?-gHTN: admission PIH labs WNL, BP elevated but non-severe, s/p 10PM PO labetalol dosing and pt asymptomatic, cont to monitor closely ?-GDMA2 last FSG 93, cont q4hr monitoring w/ ISS coverage per protocol ?-routine intrapartum care ?-anticipate SVD ? ?Jamie Acosta A Jamie Acosta ?07/10/21 ?10:53 PM ? ?

## 2021-07-10 NOTE — H&P (Signed)
Jamie Acosta is a 36 y.o. female G1P0 [redacted]w[redacted]d presenting for scheduled IOL for IUGR, gHTN, GDMA2, and AMA. ? ?Pregnancy dated by LMP c/w/ 5 week sono. Early pregnancy c/b [facility] with VB in the first trimester which resolved by the second trimester. Routine prenatal care with [facility]. Prenatal labs WNL. AMA with low risk NIPS and neg AFP1 screening. Fetal anatomy US significant for early IUGR with an EFW of 10% and Velamentous PCI noted. A follow up growth [place] was performed at 25 weeks which again showed EFW 10% and was referred to MFM for consultation. She had negative TORCH titers drawn and was followed with serial growth scans and antenatal fetal testing. EFW has been as low as 1.8% at the 33 week mark but with good interval growth and most recently performed at 36.1 showing EFW of 4#14 (3.9%) with normal weekly UAD and reassuring fetal status on weekly BPP and twice weekly NST monitoring. Pregnancy also significant for gestational hypertension. Patient with no history of hypertension outside of pregnancy but was noted to have upper range of normal as early as the second trimester. By 28 weeks patient did meet criteria for gHTN and was started on Labetalol at 30 weeks, most recently at 200 mg TID dosing. She has remained asymptomatic and normal weekly PIH labs without evidence of proteinuria, despite increasing BP over past week averaging 130s-140s/90s-100s. Pregnancy also with GDMA2, blood sugars well controlled on Metformin 500 mg qHS.  ? ?Patient is otherwise healthy with no additional contributing PMH/PSH ?She is accompanied by her husband and mother for labor support and excited to welcome a baby boy ? ?OB History   ? ? Gravida  ?1  ? Para  ?   ? Term  ?   ? Preterm  ?   ? AB  ?   ? Living  ?   ?  ? ? SAB  ?   ? IAB  ?   ? Ectopic  ?   ? Multiple  ?   ? Live Births  ?   ?   ?  ?  ? ?Past Medical History:  ?Diagnosis Date  ? Gestational diabetes   ? Pregnancy induced hypertension   ? Seasonal allergies    ? Velamentous insertion of umbilical cord   ? ?Past Surgical History:  ?Procedure Laterality Date  ? WISDOM TOOTH EXTRACTION Bilateral   ? ?Family History: family history includes COPD in her paternal grandmother; Hypertension in her father; Seizures in her sister. ?Social History:  reports that she has never smoked. She does not have any smokeless tobacco history on file. She reports that she does not currently use alcohol. She reports that she does not use drugs. ? ? ?  ?Maternal Diabetes: Yes:  Diabetes Type:  Insulin/Medication controlled- A2 Metformin controlled ?Genetic Screening: Normal ?Maternal Ultrasounds/Referrals: IUGR, Velamentous PCI ?Fetal Ultrasounds or other Referrals:  Referred to Materal Fetal Medicine  ?Maternal Substance Abuse:  No ?Significant Maternal Medications:  Meds include: Other: Labetalol, Metformin ?Significant Maternal Lab Results:  Group B Strep negative ?Other Comments:  None ? ?Review of Systems  ?All other systems reviewed and are negative. Per HPI ?Maternal Exam:  ?Uterine Assessment: Contraction frequency is rare.  ?Abdomen: Patient reports no abdominal tenderness. Estimated fetal weight is 5#0.   ?Fetal presentation: vertex ?Introitus: Normal vulva. Normal vagina.  Pelvis: adequate for delivery.   ?Cervix: Cervix evaluated by digital exam.   ? ? ?Fetal Exam ?Fetal Monitor Review: Baseline rate: 130.  ?Variability: moderate (  6-25 bpm).   ?Pattern: accelerations present and no decelerations.   ?Fetal State Assessment: Category I - tracings are normal. ? ?Physical Exam ?Vitals reviewed.  ?Constitutional:   ?   Appearance: Normal appearance. She is normal weight.  ?HENT:  ?   Head: Normocephalic.  ?Cardiovascular:  ?   Rate and Rhythm: Normal rate.  ?Pulmonary:  ?   Effort: Pulmonary effort is normal.  ?Abdominal:  ?   Palpations: Abdomen is soft.  ?Genitourinary: ?   General: Normal vulva.  ?Musculoskeletal:     ?   General: Normal range of motion.  ?   Cervical back: Normal  range of motion.  ?Skin: ?   General: Skin is warm and dry.  ?Neurological:  ?   General: No focal deficit present.  ?   Mental Status: She is alert and oriented to person, place, and time.  ?Psychiatric:     ?   Mood and Affect: Mood normal.     ?   Behavior: Behavior normal.  ?  ?Dilation: 1 ?Effacement (%): 50 ?Exam by:: Dr. Conni Elliot ?Height 5\' 1"  (1.549 m), weight 80.2 kg, last menstrual period 10/21/2020. ? ? ?Prenatal labs: ?ABO, Rh:  ?--/--/A POS (04/22 1052) ?Antibody: NEG (04/22 1052) ?Rubella: Immune (10/18 0000) ?RPR: Nonreactive (10/18 0000)  ?HBsAg: Negative (10/18 0000)  ?HIV: Non-reactive (10/18 0000)  ?GBS: Negative/-- (04/06 0000)  ? ?ChemistryNo results for input(s): NA, K, CL, CO2, GLUCOSE, BUN, CREATININE, CALCIUM, GFRNONAA, GFRAA, ANIONGAP in the last 168 hours.  ?No results for input(s): PROT, ALBUMIN, AST, ALT, ALKPHOS, BILITOT in the last 168 hours. ?Hematology ?Recent Labs  ?Lab 07/10/21 ?1052  ?WBC 7.9  ?RBC 4.37  ?HGB 13.5  ?HCT 39.0  ?MCV 89.2  ?MCH 30.9  ?MCHC 34.6  ?RDW 13.2  ?PLT 159  ? ?Cardiac EnzymesNo results for input(s): TROPONINI in the last 168 hours. No results for input(s): TROPIPOC in the last 168 hours.  ?BNPNo results for input(s): BNP, PROBNP in the last 168 hours.  ?DDimer No results for input(s): DDIMER in the last 168 hours. ? ? ?Assessment/Plan: ?Jamie Acosta is a 36 y.o. female G1P0 [redacted]w[redacted]d admitted for scheduled IOL for IUGR, gHTN, GDMA2, AMA ? ?-Admission to LD ?-Routine admission labs ?-gHTN- will continue home PO Labetalol 200 mg TID intrapartum with close BP monitoring, CMP added to admission labs, low threshold for Magnesium seizure ppx for any signs/symptoms of PEC SF ?-GDMA2- FSG monitoring q4hr in latent and 1-2hr in active labor with ISS coverage per protocol ?-IUGR/Velamentous PCI- continuous EFM/Toco- Cat I currently, will cont to monitor ?-IOL with vaginal cytotec initiated, cont q4hr PRN cervical ripening. Consider Foley balloon with next exam. Patient  aware of risks for fetal intolerance to labor given IUGR and Velamentous PCI ?-IV pain medications/Epidural on patient request ?-GBS NEG ?-RH POS, Rubella Imm ?-Routine intrapartum care ?-Anticipate SVD ? ?Jamie Acosta ?07/10/2021, 12:07 PM ? ? ? ?

## 2021-07-11 ENCOUNTER — Encounter (HOSPITAL_COMMUNITY): Payer: Self-pay | Admitting: Obstetrics and Gynecology

## 2021-07-11 ENCOUNTER — Inpatient Hospital Stay (HOSPITAL_COMMUNITY): Payer: BC Managed Care – PPO | Admitting: Anesthesiology

## 2021-07-11 DIAGNOSIS — O139 Gestational [pregnancy-induced] hypertension without significant proteinuria, unspecified trimester: Secondary | ICD-10-CM | POA: Diagnosis present

## 2021-07-11 DIAGNOSIS — O43129 Velamentous insertion of umbilical cord, unspecified trimester: Secondary | ICD-10-CM | POA: Diagnosis present

## 2021-07-11 DIAGNOSIS — O09519 Supervision of elderly primigravida, unspecified trimester: Secondary | ICD-10-CM

## 2021-07-11 LAB — CBC
HCT: 34.6 % — ABNORMAL LOW (ref 36.0–46.0)
HCT: 38.2 % (ref 36.0–46.0)
Hemoglobin: 11.9 g/dL — ABNORMAL LOW (ref 12.0–15.0)
Hemoglobin: 13.4 g/dL (ref 12.0–15.0)
MCH: 31.2 pg (ref 26.0–34.0)
MCH: 31.8 pg (ref 26.0–34.0)
MCHC: 34.4 g/dL (ref 30.0–36.0)
MCHC: 35.1 g/dL (ref 30.0–36.0)
MCV: 90.5 fL (ref 80.0–100.0)
MCV: 90.8 fL (ref 80.0–100.0)
Platelets: 138 10*3/uL — ABNORMAL LOW (ref 150–400)
Platelets: 151 10*3/uL (ref 150–400)
RBC: 3.81 MIL/uL — ABNORMAL LOW (ref 3.87–5.11)
RBC: 4.22 MIL/uL (ref 3.87–5.11)
RDW: 13.2 % (ref 11.5–15.5)
RDW: 13.3 % (ref 11.5–15.5)
WBC: 11.2 10*3/uL — ABNORMAL HIGH (ref 4.0–10.5)
WBC: 15.2 10*3/uL — ABNORMAL HIGH (ref 4.0–10.5)
nRBC: 0 % (ref 0.0–0.2)
nRBC: 0 % (ref 0.0–0.2)

## 2021-07-11 LAB — COMPREHENSIVE METABOLIC PANEL WITH GFR
ALT: 18 U/L (ref 0–44)
AST: 27 U/L (ref 15–41)
Albumin: 2.6 g/dL — ABNORMAL LOW (ref 3.5–5.0)
Alkaline Phosphatase: 72 U/L (ref 38–126)
Anion gap: 7 (ref 5–15)
BUN: 7 mg/dL (ref 6–20)
CO2: 23 mmol/L (ref 22–32)
Calcium: 8.7 mg/dL — ABNORMAL LOW (ref 8.9–10.3)
Chloride: 105 mmol/L (ref 98–111)
Creatinine, Ser: 0.69 mg/dL (ref 0.44–1.00)
GFR, Estimated: >60 mL/min (ref >60)
Glucose, Bld: 143 mg/dL — ABNORMAL HIGH (ref 70–99)
Potassium: 3.8 mmol/L (ref 3.5–5.1)
Sodium: 135 mmol/L (ref 135–145)
Total Bilirubin: 0.3 mg/dL (ref 0.3–1.2)
Total Protein: 4.7 g/dL — ABNORMAL LOW (ref 6.5–8.1)

## 2021-07-11 LAB — GLUCOSE, CAPILLARY
Glucose-Capillary: 97 mg/dL (ref 70–99)
Glucose-Capillary: 133 mg/dL — ABNORMAL HIGH (ref 70–99)

## 2021-07-11 MED ORDER — HYDRALAZINE HCL 20 MG/ML IJ SOLN: 5.0000 mg | INTRAMUSCULAR | Status: DC | PRN

## 2021-07-11 MED ORDER — IBUPROFEN 600 MG PO TABS
600.0000 mg | ORAL_TABLET | Freq: Four times a day (QID) | ORAL | Status: DC
  Administered 2021-07-11 – 2021-07-13 (×8): 600 mg via ORAL
  Filled 2021-07-11 (×8): qty 1

## 2021-07-11 MED ORDER — ACETAMINOPHEN 325 MG PO TABS
650.0000 mg | ORAL_TABLET | ORAL | Status: DC | PRN
  Administered 2021-07-12 (×2): 650 mg via ORAL
  Filled 2021-07-11 (×2): qty 2

## 2021-07-11 MED ORDER — EPHEDRINE 5 MG/ML INJ: 10.0000 mg | INTRAVENOUS | Status: DC | PRN

## 2021-07-11 MED ORDER — COCONUT OIL OIL: 1.0000 "application " | TOPICAL_OIL | Status: DC | PRN

## 2021-07-11 MED ORDER — PRENATAL MULTIVITAMIN CH
1.0000 | ORAL_TABLET | Freq: Every day | ORAL | Status: DC
  Administered 2021-07-11 – 2021-07-12 (×2): 1 via ORAL
  Filled 2021-07-11 (×2): qty 1

## 2021-07-11 MED ORDER — PHENYLEPHRINE 80 MCG/ML (10ML) SYRINGE FOR IV PUSH (FOR BLOOD PRESSURE SUPPORT): 80.0000 ug | PREFILLED_SYRINGE | INTRAVENOUS | Status: DC | PRN

## 2021-07-11 MED ORDER — ONDANSETRON HCL 4 MG/2ML IJ SOLN: 4.0000 mg | INTRAMUSCULAR | Status: DC | PRN

## 2021-07-11 MED ORDER — LACTATED RINGERS IV SOLN: INTRAVENOUS | Status: DC

## 2021-07-11 MED ORDER — BENZOCAINE-MENTHOL 20-0.5 % EX AERO
1.0000 "application " | INHALATION_SPRAY | CUTANEOUS | Status: DC | PRN
  Administered 2021-07-11: 1 via TOPICAL
  Filled 2021-07-11 (×2): qty 56

## 2021-07-11 MED ORDER — LACTATED RINGERS IV SOLN
500.0000 mL | Freq: Once | INTRAVENOUS | Status: AC
  Administered 2021-07-11: 250 mL via INTRAVENOUS

## 2021-07-11 MED ORDER — SENNOSIDES-DOCUSATE SODIUM 8.6-50 MG PO TABS
2.0000 | ORAL_TABLET | Freq: Every day | ORAL | Status: DC
  Administered 2021-07-12 – 2021-07-13 (×2): 2 via ORAL
  Filled 2021-07-11 (×2): qty 2

## 2021-07-11 MED ORDER — DIPHENHYDRAMINE HCL 25 MG PO CAPS: 25.0000 mg | ORAL_CAPSULE | Freq: Four times a day (QID) | ORAL | Status: DC | PRN

## 2021-07-11 MED ORDER — FENTANYL-BUPIVACAINE-NACL 0.5-0.125-0.9 MG/250ML-% EP SOLN
EPIDURAL | Status: DC | PRN
  Administered 2021-07-11: 12 mL/h via EPIDURAL

## 2021-07-11 MED ORDER — DIPHENHYDRAMINE HCL 50 MG/ML IJ SOLN: 12.5000 mg | INTRAMUSCULAR | Status: DC | PRN

## 2021-07-11 MED ORDER — HYDRALAZINE HCL 20 MG/ML IJ SOLN: 10.0000 mg | INTRAMUSCULAR | Status: DC | PRN

## 2021-07-11 MED ORDER — DIBUCAINE (PERIANAL) 1 % EX OINT: 1.0000 "application " | TOPICAL_OINTMENT | CUTANEOUS | Status: DC | PRN

## 2021-07-11 MED ORDER — LIDOCAINE HCL (PF) 1 % IJ SOLN
INTRAMUSCULAR | Status: DC | PRN
  Administered 2021-07-11: 2 mL via EPIDURAL
  Administered 2021-07-11: 10 mL via EPIDURAL

## 2021-07-11 MED ORDER — MAGNESIUM SULFATE BOLUS VIA INFUSION
4.0000 g | Freq: Once | INTRAVENOUS | Status: AC
  Administered 2021-07-11: 4 g via INTRAVENOUS
  Filled 2021-07-11: qty 1000

## 2021-07-11 MED ORDER — LABETALOL HCL 5 MG/ML IV SOLN: 20.0000 mg | INTRAVENOUS | Status: DC | PRN

## 2021-07-11 MED ORDER — MAGNESIUM SULFATE 40 GM/1000ML IV SOLN
2.0000 g/h | INTRAVENOUS | Status: DC
  Administered 2021-07-11 – 2021-07-12 (×2): 2 g/h via INTRAVENOUS
  Filled 2021-07-11 (×2): qty 1000

## 2021-07-11 MED ORDER — FENTANYL-BUPIVACAINE-NACL 0.5-0.125-0.9 MG/250ML-% EP SOLN
12.0000 mL/h | EPIDURAL | Status: DC | PRN
  Filled 2021-07-11: qty 250

## 2021-07-11 MED ORDER — LORATADINE 10 MG PO TABS
10.0000 mg | ORAL_TABLET | Freq: Every day | ORAL | Status: DC
  Administered 2021-07-11 – 2021-07-13 (×3): 10 mg via ORAL
  Filled 2021-07-11 (×3): qty 1

## 2021-07-11 MED ORDER — LABETALOL HCL 200 MG PO TABS
200.0000 mg | ORAL_TABLET | Freq: Three times a day (TID) | ORAL | Status: DC
  Administered 2021-07-11: 200 mg via ORAL
  Filled 2021-07-11: qty 1

## 2021-07-11 MED ORDER — NIFEDIPINE ER OSMOTIC RELEASE 30 MG PO TB24
30.0000 mg | ORAL_TABLET | Freq: Every day | ORAL | Status: DC
  Administered 2021-07-11 – 2021-07-13 (×3): 30 mg via ORAL
  Filled 2021-07-11 (×3): qty 1

## 2021-07-11 MED ORDER — WITCH HAZEL-GLYCERIN EX PADS: 1.0000 "application " | MEDICATED_PAD | CUTANEOUS | Status: DC | PRN

## 2021-07-11 MED ORDER — SIMETHICONE 80 MG PO CHEW: 80.0000 mg | CHEWABLE_TABLET | ORAL | Status: DC | PRN

## 2021-07-11 MED ORDER — LABETALOL HCL 100 MG PO TABS
100.0000 mg | ORAL_TABLET | Freq: Once | ORAL | Status: AC
  Administered 2021-07-11: 100 mg via ORAL
  Filled 2021-07-11: qty 1

## 2021-07-11 MED ORDER — ONDANSETRON HCL 4 MG PO TABS: 4.0000 mg | ORAL_TABLET | ORAL | Status: DC | PRN

## 2021-07-11 MED ORDER — LABETALOL HCL 200 MG PO TABS
300.0000 mg | ORAL_TABLET | Freq: Three times a day (TID) | ORAL | Status: DC
  Administered 2021-07-11 – 2021-07-13 (×5): 300 mg via ORAL
  Filled 2021-07-11 (×5): qty 1

## 2021-07-11 MED ORDER — TETANUS-DIPHTH-ACELL PERTUSSIS 5-2.5-18.5 LF-MCG/0.5 IM SUSY: 0.5000 mL | PREFILLED_SYRINGE | Freq: Once | INTRAMUSCULAR | Status: DC

## 2021-07-11 MED ORDER — LABETALOL HCL 5 MG/ML IV SOLN: 40.0000 mg | INTRAVENOUS | Status: DC | PRN

## 2021-07-11 MED ORDER — ZOLPIDEM TARTRATE 5 MG PO TABS: 5.0000 mg | ORAL_TABLET | Freq: Every evening | ORAL | Status: DC | PRN

## 2021-07-11 NOTE — Anesthesia Preprocedure Evaluation (Signed)
Anesthesia Evaluation  ?Patient identified by MRN, date of birth, ID band ?Patient awake ? ? ? ?Reviewed: ?Allergy & Precautions, Patient's Chart, lab work & pertinent test results, reviewed documented beta blocker date and time  ? ?Airway ?Mallampati: II ? ?TM Distance: >3 FB ?Neck ROM: Full ? ? ? Dental ?no notable dental hx. ? ?  ?Pulmonary ?neg pulmonary ROS,  ?  ?Pulmonary exam normal ?breath sounds clear to auscultation ? ? ? ? ? ? Cardiovascular ?hypertension (PIH poorly controlled in final weeks- BP prior to epidural 150s-160s/110s), Pt. on medications and Pt. on home beta blockers ?Normal cardiovascular exam ?Rhythm:Regular Rate:Normal ? ? ?  ?Neuro/Psych ?negative neurological ROS ? negative psych ROS  ? GI/Hepatic ?negative GI ROS, Neg liver ROS,   ?Endo/Other  ?diabetes, Gestational, Oral Hypoglycemic Agents ? Renal/GU ?negative Renal ROS  ?negative genitourinary ?  ?Musculoskeletal ?negative musculoskeletal ROS ?(+)  ? Abdominal ?  ?Peds ?negative pediatric ROS ?(+)  Hematology ?negative hematology ROS ?(+) Hb 13.4, plt 151   ?Anesthesia Other Findings ? ? Reproductive/Obstetrics ?(+) Pregnancy ? ?  ? ? ? ? ? ? ? ? ? ? ? ? ? ?  ?  ? ? ? ? ? ? ? ? ?Anesthesia Physical ?Anesthesia Plan ? ?ASA: 3 ? ?Anesthesia Plan: Epidural  ? ?Post-op Pain Management:   ? ?Induction:  ? ?PONV Risk Score and Plan: 2 ? ?Airway Management Planned: Natural Airway ? ?Additional Equipment: None ? ?Intra-op Plan:  ? ?Post-operative Plan:  ? ?Informed Consent: I have reviewed the patients History and Physical, chart, labs and discussed the procedure including the risks, benefits and alternatives for the proposed anesthesia with the patient or authorized representative who has indicated his/her understanding and acceptance.  ? ? ? ? ? ?Plan Discussed with:  ? ?Anesthesia Plan Comments:   ? ? ? ? ? ? ?Anesthesia Quick Evaluation ? ?

## 2021-07-11 NOTE — Progress Notes (Signed)
Jamie Acosta ?February 27, 1986 ?[id number] ? ?Interval Event Note ? ?PPD#0 s/p NSVD IOL for IUGR, gHTN, GDMA2 ? ?Patient BP's reviewed since delivery this morning around 5AM and noted to be persistently elevated. Although they do not meet criteria for severe range, some have been very close to severe despite adding a second oral antihypertensive agent. Patient had been on PO Labetalol 200 mg q8hr in the pregnancy which was continued intra- and post-partum. PO Procardia 30XL was added this morning and received first dose around 10AM. Most recent BP at 1347 was 153/106. Patient also did have a couple severe range Bps at time of epidural placement although could have been influenced by pain. However given BP concerns, would recommend treating with a 24hr Magnesium sulfate seizure prophylaxis course. Risks and benefits of Magnesium reviewed with patient and questions answered to her satisfaction. Repeat PIH labs placed. Will transfer to [facility] specialty care for Mag. Close I/O and vital sign monitoring. Will monitor BP's on Mag and titrate PO antihypertensive regimen as indicated. Continue routine PP care. ? ?Jamie Acosta ?07/11/21 ?2:49 PM ? ?

## 2021-07-11 NOTE — Lactation Note (Signed)
This note was copied from a baby's chart. ?Lactation Consultation Note ? ?Patient Name: Jamie Acosta ?Today's Date: 07/11/2021 ?Reason for consult: Initial assessment;Primapara;Infant < 6lbs;Early term 37-38.6wks;Exclusive pumping and bottle feeding ?Age:38 hours ? ?P1 mother whose infant is now 44 hours old.  This is an ETI at 37+4 weeks weighing < 5 lbs.  Mother's current feeding preference is to pump and bottle feed her EBM.  She is supplementing with Similac 22 calorie formula until her milk comes to volume. ? ?Reviewed the LPTI guidelines with family.  Baby has consumed adequate amounts of formula x 2 since birth.  Mother reports no difficulty with bottle feeding.  Encouraged feeding at least every three hours or sooner if he shows feeding cues.  Reviewed basic newborn care.  Initiated the DEBP; size 24 flanges are appropriate at this time.  Instructed mother on how to assess for correct flange size.  She will pump every three hours and feed back any EBM she obtains to baby.  Wash station set up.  ? ?Father and grandparents present.  Mother will call for any further questions.  RN updated. ? ? ?Maternal Data ?Has patient been taught Hand Expression?: Yes ?Does the patient have breastfeeding experience prior to this delivery?: No (Mother is pumping only) ? ?Feeding ?Mother's Current Feeding Choice: Breast Milk and Formula ?Nipple Type: Nfant Standard Flow (white) ? ?LATCH Score ?  ? ?  ? ?  ? ?  ? ?  ? ?  ? ? ?Lactation Tools Discussed/Used ?Tools: Pump;Flanges ?Flange Size: 24 ?Breast pump type: Double-Electric Breast Pump;Manual ?Pump Education: Setup, frequency, and cleaning;Milk Storage ?Reason for Pumping: Mother's desire to pump and bottle feed her EBM ?Pumping frequency: Every three hours ? ?Interventions ?Interventions: Education;LC Services brochure;LPT handout/interventions;Hand pump;Coconut oil ? ?Discharge ?Pump: Personal (Spectra) ? ?Consult Status ?Consult Status: Follow-up ?Date:  07/12/21 ?Follow-up type: In-patient ? ? ? ?Oreta Soloway R Mamoudou Mulvehill ?07/11/2021, 10:19 AM ? ? ? ?

## 2021-07-11 NOTE — Progress Notes (Signed)
Dr Conni Elliot notified of high Blood pressure/she stated she had ordered Procardia and she was aware of her elevated b/p  No further orders ?

## 2021-07-11 NOTE — Anesthesia Postprocedure Evaluation (Signed)
Anesthesia Post Note ? ?Patient: Jamie Acosta ? ?Procedure(s) Performed: AN AD HOC LABOR EPIDURAL ? ?  ? ?Patient location during evaluation: Mother Baby ?Anesthesia Type: Epidural ?Level of consciousness: awake, oriented and awake and alert ?Pain management: pain level controlled ?Vital Signs Assessment: vitals unstable ?Respiratory status: respiratory function stable, spontaneous breathing and nonlabored ventilation ?Cardiovascular status: See above note. ?Postop Assessment: no headache, adequate PO intake, able to ambulate, patient able to bend at knees and no apparent nausea or vomiting ?Anesthetic complications: no ? ? ?No notable events documented. ? ?Last Vitals:  ?Vitals:  ? 07/11/21 0814 07/11/21 0930  ?BP: (!) 137/106 (!) 142/109  ?Pulse: 84 71  ?Resp: 18 18  ?Temp: 36.4 ?C 36.5 ?C  ?  ?Last Pain:  ?Vitals:  ? 07/11/21 0930  ?TempSrc:   ?PainSc: 0-No pain  ? ?Pain Goal:   ? ?  ?  ?  ?  ?  ?  ?Epidural/Spinal Function Cutaneous sensation: Normal sensation (07/11/21 0930), Patient able to flex knees: Yes (07/11/21 0930), Patient able to lift hips off bed: Yes (07/11/21 0930), Back pain beyond tenderness at insertion site: No (07/11/21 0930), Progressively worsening motor and/or sensory loss: No (07/11/21 0930), Bowel and/or bladder incontinence post epidural: No (07/11/21 0930) ? ?Jamie Acosta ? ? ? ? ?

## 2021-07-11 NOTE — Anesthesia Procedure Notes (Signed)
Epidural ?Patient location during procedure: OB ?Start time: 07/11/2021 12:50 AM ?End time: 07/11/2021 1:00 AM ? ?Staffing ?Anesthesiologist: Lannie Fields, DO ?Performed: anesthesiologist  ? ?Preanesthetic Checklist ?Completed: patient identified, IV checked, risks and benefits discussed, monitors and equipment checked, pre-op evaluation and timeout performed ? ?Epidural ?Patient position: sitting ?Prep: DuraPrep and site prepped and draped ?Patient monitoring: continuous pulse ox, blood pressure, heart rate and cardiac monitor ?Approach: midline ?Location: L3-L4 ?Injection technique: LOR air ? ?Needle:  ?Needle type: Tuohy  ?Needle gauge: 17 G ?Needle length: 9 cm ?Needle insertion depth: 7 cm ?Catheter type: closed end flexible ?Catheter size: 19 Gauge ?Catheter at skin depth: 12 cm ?Test dose: negative ? ?Assessment ?Sensory level: T8 ?Events: blood not aspirated, injection not painful, no injection resistance, no paresthesia and negative IV test ? ?Additional Notes ?Patient identified. Risks/Benefits/Options discussed with patient including but not limited to bleeding, infection, nerve damage, paralysis, failed block, incomplete pain control, headache, blood pressure changes, nausea, vomiting, reactions to medication both or allergic, itching and postpartum back pain. Confirmed with bedside nurse the patient's most recent platelet count. Confirmed with patient that they are not currently taking any anticoagulation, have any bleeding history or any family history of bleeding disorders. Patient expressed understanding and wished to proceed. All questions were answered. Sterile technique was used throughout the entire procedure. Please see nursing notes for vital signs. Test dose was given through epidural catheter and negative prior to continuing to dose epidural or start infusion. Warning signs of high block given to the patient including shortness of breath, tingling/numbness in hands, complete motor  block, or any concerning symptoms with instructions to call for help. Patient was given instructions on fall risk and not to get out of bed. All questions and concerns addressed with instructions to call with any issues or inadequate analgesia.  Reason for block:procedure for pain ? ? ? ?

## 2021-07-12 LAB — CBC
HCT: 34 % — ABNORMAL LOW (ref 36.0–46.0)
Hemoglobin: 11.8 g/dL — ABNORMAL LOW (ref 12.0–15.0)
MCH: 31.3 pg (ref 26.0–34.0)
MCHC: 34.7 g/dL (ref 30.0–36.0)
MCV: 90.2 fL (ref 80.0–100.0)
Platelets: 140 10*3/uL — ABNORMAL LOW (ref 150–400)
RBC: 3.77 MIL/uL — ABNORMAL LOW (ref 3.87–5.11)
RDW: 13.7 % (ref 11.5–15.5)
WBC: 11.8 10*3/uL — ABNORMAL HIGH (ref 4.0–10.5)
nRBC: 0 % (ref 0.0–0.2)

## 2021-07-12 NOTE — Lactation Note (Signed)
This note was copied from a baby's chart. ?Lactation Consultation Note ? ?Patient Name: Jamie Acosta ?Today's Date: 07/12/2021 ?Reason for consult: Follow-up assessment;Mother's request;Exclusive pumping and bottle feeding;Early term 37-38.6wks;Infant < 6lbs;Breastfeeding assistance;Maternal endocrine disorder (PIH) ?Age:38 hours ? ?Mom states 24 flange comfortable and getting 5-7 ml per pumping session.  ? ?Plan 1. To feed based on cues 8-12x 24hr period ?2. Mom to offer EBM first followed by formula 15-30 ml per feeding to increase as tolerated. Volume supplementation guide provided since Mom pumping only and offering first EBM followed by formula.  ?3. DEBP q 3hrs for 15 min . ?All questions answered at the end of the visit.  ? ?Maternal Data ?Has patient been taught Hand Expression?: Yes ? ?Feeding ?Mother's Current Feeding Choice: Breast Milk and Formula ? ?LATCH Score ?  ? ?  ? ?  ? ?  ? ?  ? ?  ? ? ?Lactation Tools Discussed/Used ?Tools: Pump;Flanges ?Flange Size: 24 ?Breast pump type: Double-Electric Breast Pump ?Pump Education: Setup, frequency, and cleaning;Milk Storage ?Reason for Pumping: increase stimulation ?Pumping frequency: every 3 hrs for 15 min ? ?Interventions ?Interventions: Breast feeding basics reviewed;Assisted with latch;Skin to skin;Breast massage;Hand express;Expressed milk;DEBP;Education;Infant Driven Feeding Algorithm education ? ?Discharge ?Pump: DEBP ? ?Consult Status ?Consult Status: Follow-up ?Date: 07/13/21 ?Follow-up type: In-patient ? ? ? ?Ailyne Pawley  Nicholson-Springer ?07/12/2021, 2:00 PM ? ? ? ?

## 2021-07-12 NOTE — Progress Notes (Signed)
PPD#1 S/P NSVD ? ?Live born female  ?Birth Weight: 4 lb 14.3 oz (2220 g) ?APGAR: 8, 9 ? ?Newborn Delivery   ?Birth date/time: 07[birth year]21 04:57:00 ?Delivery type: Vaginal, Spontaneous ?  ?  ?Baby name: Jamie Acosta ? ?Delivering provider: LAW, CASSANDRA A  ? ?Lacerations:1st degree  ? ?Circumcision Yes, planning prior to discharge ? ?Feeding: breast and bottle ? ?Pain control at delivery: Epidural  ? ?S:  Reports feeling well. Denies PEC symptoms. Mild dizziness when ambulating. Family at the bedside.  ?            Tolerating PO/No nausea or vomiting ?            Bleeding is light ?            Pain controlled with acetaminophen and ibuprofen (OTC) ?            Up ad lib/ambulatory/voiding without difficulties  ? ?O:  A & O x 3, in no apparent distress  ?Vitals:  ? 07/12/21 0400 07/12/21 0550 07/12/21 0627 07/12/21 0750  ?BP: 131/90   (!) 145/98  ?Pulse: 79   85  ?Resp: 17 20 17 18   ?Temp: 97.7 ?F (36.5 ?C)   97.8 ?F (36.6 ?C)  ?TempSrc: Oral   Oral  ?SpO2: 99%   100%  ?Weight:      ?Height:      ? ?Recent Labs  ?  07/11/21 ?1422 07/12/21 ?07/14/21  ?WBC 15.2* 11.8*  ?HGB 11.9* 11.8*  ?HCT 34.6* 34.0*  ?PLT 138* 140*  ? ? Blood type: --/--/A POS (04/22 1052) ? Rubella: Immune (10/18 0000)  ? I&O: I/O last 3 completed shifts: ?In: 4469.8 [P.O.:3000; I.V.:1469.8] ?Out: 5775 [Urine:5700; Blood:75] ?         No intake/output data recorded. ? ?Gen: AAO x 3, NAD ?Abdomen: soft, non-tender, non-distended ?Fundus: firm, non-tender, U-1 ?  Perineum: repair intact ?  Lochia: small ?  Extremities: 1+ non-pitting edema, no calf pain or tenderness ?  ?A/P:  ?PPD # 1 36 y.o., G1P1001  ?Principal Problem: ?  Postpartum care following vaginal delivery ? Doing well - stable status ? Routine post partum orders ?Active Problems: ?  Gestational diabetes mellitus, class A2 ? CBGs stable ? F/U postpartum ?  Encounter for induction of labor ?  Pregnancy induced hypertension ? Continue magnesium sulfate until 1600 ? Labs stable ? BPs mild range ? UOP  5700cc since Magnesium was started ? Continue Procardia XL 30mg  daily and Labetalol 300mg  TID ? Monitor BP closely after magnesium discontinued ?  Umbilical cord, velamentous insertion ?  Advanced maternal age, 1st pregnancy ? ?Anticipate discharge tomorrow if BPs are in good control.  ?  ?12-03-1993, MSN, CNM ?07/12/2021, 10:47 AM ? ? ?  ?

## 2021-07-13 LAB — SURGICAL PATHOLOGY

## 2021-07-13 MED ORDER — NIFEDIPINE ER 30 MG PO TB24: 30.0000 mg | ORAL_TABLET | Freq: Every day | ORAL | 1 refills | Status: DC

## 2021-07-13 MED ORDER — ACETAMINOPHEN 325 MG PO TABS: 650.0000 mg | ORAL_TABLET | ORAL | 1 refills | Status: DC | PRN

## 2021-07-13 MED ORDER — LABETALOL HCL 300 MG PO TABS: 300.0000 mg | ORAL_TABLET | Freq: Three times a day (TID) | ORAL | 1 refills | Status: DC

## 2021-07-13 MED ORDER — IBUPROFEN 600 MG PO TABS: 600.0000 mg | ORAL_TABLET | Freq: Four times a day (QID) | ORAL | 0 refills | Status: DC

## 2021-07-13 NOTE — Progress Notes (Signed)
Discharge instructions and prescriptions given to pt. Discussed post vaginal delivery care, signs and symptoms to report to the MD, upcoming appointments, and meds. Pt verbalizes understanding and has no questions or concerns at this time. Pt discharged home from hospital with baby in stable condition. 

## 2021-07-13 NOTE — Lactation Note (Signed)
This note was copied from a baby's chart. ?Lactation Consultation Note ? ?Patient Name: Jamie Acosta ?Today's Date: 07/13/2021 ?Reason for consult: Follow-up assessment;Mother's request;Exclusive pumping and bottle feeding;Early term 37-38.6wks;Infant < 6lbs;Breastfeeding assistance;Infant weight loss ?Age:38 hours ? ?Mom compression with pumping and adjusted flange size to 24 and 27.  ? ?Infant recent circ, tired but able to get him to take 29 ml of 22 cal. LC reviewed LPTI guidelines to reduce calorie loss and went over volume based on hrs of age since delivery.  ?Mom continue pumping q 3hrs with dEBP for 15 min. Mom working on increasing her milk supply.  ? ?All questions answered at the end of the visit.  ? ?Maternal Data ?Has patient been taught Hand Expression?: Yes ? ?Feeding ?Mother's Current Feeding Choice: Breast Milk and Formula ?Nipple Type: Nfant Standard Flow (white) ? ?LATCH Score ?  ? ?  ? ?  ? ?  ? ?  ? ?  ? ? ?Lactation Tools Discussed/Used ?Tools: Pump;Flanges;Coconut oil ?Flange Size: 24;27 ?Breast pump type: Double-Electric Breast Pump ?Pump Education: Setup, frequency, and cleaning;Milk Storage ?Reason for Pumping: increase stimulation ?Pumping frequency: every 3 hrs for 15 min ? ?Interventions ?Interventions: Breast feeding basics reviewed;Expressed milk;Coconut oil;DEBP;Education;Pace feeding;LC Psychologist, educational;Infant Driven Feeding Algorithm education;LPT handout/interventions ? ?Discharge ?Discharge Education: Engorgement and breast care;Warning signs for feeding baby;Outpatient recommendation;Outpatient Epic message sent ?Pump: Personal;DEBP ? ?Consult Status ?Consult Status: Complete ?Date: 07/13/21 ? ? ? ?Exavior Kimmons  Nicholson-Springer ?07/13/2021, 12:01 PM ? ? ? ?

## 2021-07-13 NOTE — Discharge Summary (Signed)
OB Discharge Summary ? ?Patient Name: Jamie Acosta ?DOB: 06/01/1985 ?MRN: [id number] ? ?Date of admission: 07/10/2021 ?Delivering provider: LAW, CASSANDRA A  ? ?Admitting diagnosis: Encounter for induction of labor [Z34.90] ?Intrauterine pregnancy: [redacted]w[redacted]d     ?Secondary diagnosis: ?Patient Active Problem List  ? Diagnosis Date Noted  ? Postpartum care following vaginal delivery 4/23 07/11/2021  ? Pregnancy induced hypertension 07/11/2021  ? Umbilical cord, velamentous insertion 07/11/2021  ? Advanced maternal age, 1st pregnancy 07/11/2021  ? Encounter for induction of labor 07/10/2021  ? Gestational diabetes mellitus, class A2 05/20/2021  ?  ?Date of discharge: 07/13/2021   ?Discharge diagnosis: Principal Problem: ?  Postpartum care following vaginal delivery 4/23 ?Active Problems: ?  Gestational diabetes mellitus, class A2 ?  Encounter for induction of labor ?  Pregnancy induced hypertension ?  Umbilical cord, velamentous insertion ?  Advanced maternal age, 1st pregnancy ?                                                            ?Post partum procedures: 24 hour postpartum magnesium sulfate ? ?Augmentation: AROM, Pitocin, Cytotec, and IP Foley ?Pain control: Epidural  ?Laceration:1st degree  ?Complications: None ? ?Hospital course:  Induction of Labor With Vaginal Delivery   ?36 y.o. yo G1P1001 at [redacted]w[redacted]d was admitted to the hospital 07/10/2021 for induction of labor.  Indication for induction: Gestational hypertension.  Patient had an uncomplicated labor course as follows: ?Membrane Rupture Time/Date: 10:39 PM ,07/10/2021   ?Delivery Method:Vaginal, Spontaneous  ?Episiotomy: None  ?Lacerations:  1st degree  ?Details of delivery can be found in separate delivery note.  Patient had a routine postpartum course. Patient is discharged home 07/13/21. ? ?Newborn Data: ?Birth date:07/11/2021  ?Birth time:4:57 AM  ?Gender:Female  ?Living status:Living  ?Apgars:8 ,9  ?Weight:2220 g  ? ?Physical exam  ?Vitals:  ? 07/12/21 1957  07/12/21 2345 07/13/21 [id number] 07/13/21 0758  ?BP: (!) 129/92 124/77 114/73 132/89  ?Pulse: 76 81 70 75  ?Resp: 18 18 17 18   ?Temp: 98 ?F (36.7 ?C) 98.1 ?F (36.7 ?C) 98.3 ?F (36.8 ?C) 98.1 ?F (36.7 ?C)  ?TempSrc: Oral Oral Oral Oral  ?SpO2: 99% 98% 100% 96%  ?Weight:      ?Height:      ? ?General: alert and cooperative ?Lochia: appropriate ?Uterine Fundus: firm ?Incision: N/A ?Perineum: repair intact, no edema ?DVT Evaluation: No evidence of DVT seen on physical exam. ? ?Labs: ?Lab Results  ?Component Value Date  ? WBC 11.8 (H) 07/12/2021  ? HGB 11.8 (L) 07/12/2021  ? HCT 34.0 (L) 07/12/2021  ? MCV 90.2 07/12/2021  ? PLT 140 (L) 07/12/2021  ? ? ?  Latest Ref Rng & Units 07/11/2021  ?  2:22 PM  ?CMP  ?Glucose 70 - 99 mg/dL 143    ?BUN 6 - 20 mg/dL 7    ?Creatinine 0.44 - 1.00 mg/dL 0.69    ?Sodium 135 - 145 mmol/L 135    ?Potassium 3.5 - 5.1 mmol/L 3.8    ?Chloride 98 - 111 mmol/L 105    ?CO2 22 - 32 mmol/L 23    ?Calcium 8.9 - 10.3 mg/dL 8.7    ?Total Protein 6.5 - 8.1 g/dL 4.7    ?Total Bilirubin 0.3 - 1.2 mg/dL 0.3    ?Alkaline Phos 38 - 126  U/L 72    ?AST 15 - 41 U/L 27    ?ALT 0 - 44 U/L 18    ? ? ?  07/12/2021  ? 10:24 PM  ?Edinburgh Postnatal Depression Scale Screening Tool  ?I have been able to laugh and see the funny side of things. 0  ?I have looked forward with enjoyment to things. 0  ?I have blamed myself unnecessarily when things went wrong. 1  ?I have been anxious or worried for no good reason. 1  ?I have felt scared or panicky for no good reason. 1  ?Things have been getting on top of me. 0  ?I have been so unhappy that I have had difficulty sleeping. 1  ?I have felt sad or miserable. 0  ?I have been so unhappy that I have been crying. 0  ?The thought of harming myself has occurred to me. 0  ?Edinburgh Postnatal Depression Scale Total 4  ? ?Discharge instructions:  ?per After Visit Summary ? ?After Visit Meds:  ?Allergies as of 07/13/2021   ?No Known Allergies ?  ? ?  ?Medication List  ?  ? ?STOP taking  these medications   ? ?FIBER ADULT GUMMIES PO ?  ?folic acid [id number] MCG tablet ?Commonly known as: FOLVITE ?  ?metformin 500 MG (OSM) 24 hr tablet ?Commonly known as: FORTAMET ?  ? ?  ? ?TAKE these medications   ? ?acetaminophen 325 MG tablet ?Commonly known as: Tylenol ?Take 2 tablets (650 mg total) by mouth every 4 (four) hours as needed (for pain scale < 4). ?  ?ibuprofen 600 MG tablet ?Commonly known as: ADVIL ?Take 1 tablet (600 mg total) by mouth every 6 (six) hours. ?  ?labetalol 300 MG tablet ?Commonly known as: NORMODYNE ?Take 1 tablet (300 mg total) by mouth 3 (three) times daily. ?What changed:  ?medication strength ?how much to take ?  ?NIFEdipine 30 MG 24 hr tablet ?Commonly known as: ADALAT CC ?Take 1 tablet (30 mg total) by mouth daily. ?Start taking on: July 14, 2021 ?  ?prenatal vitamin w/FE, FA 27-1 MG Tabs tablet ?Take 1 tablet by mouth daily at 12 noon. ?  ? ?  ? ?Activity: Advance as tolerated. Pelvic rest for 6 weeks.  ? ?Newborn Data: ?Live born female  ?Birth Weight: 4 lb 14.3 oz (2220 g) ?APGAR: 8, 9 ? ?Newborn Delivery   ?Birth date/time: 07/[birth year]/2021 04:57:00 ?Delivery type: Vaginal, Spontaneous ?  ?  ?Named Cletus Gash ?Baby Feeding: Bottle and Breast ?Disposition:home with mother ? ?Delivery Report: ? ?Review the Delivery Report for details.   ? ?Follow up: ? Follow-up Information   ? ? Law, Cassandra A, DO. Schedule an appointment as soon as possible for a visit on 07/16/2021.   ?Specialty: Obstetrics and Gynecology ?Why: For blood pressure check. ?Contact information: ?[address] ?[place] [address] ?[phone] ? ? ?  ?  ? ?  ?  ? ?  ? ?Zettie Pho, MSN ?07/13/2021, 12:16 PM ?  ?

## 2021-07-16 DIAGNOSIS — O135 Gestational [pregnancy-induced] hypertension without significant proteinuria, complicating the puerperium: Secondary | ICD-10-CM | POA: Diagnosis not present

## 2021-07-20 ENCOUNTER — Telehealth (HOSPITAL_COMMUNITY): Payer: Self-pay

## 2021-07-20 NOTE — Telephone Encounter (Signed)
"  Alittle tired but good. Getting in the swing of things but everything is going well. Still alittle sore in my vaginal area. I had an appointment on Friday with my OB-GYN for a BP check. She checked everything and said everything looked good and is healing normally. My BP is getting there. I'm going weekly to my OB." Patient has no questions or concerns about her healing. ? ?"He is doing awesome. He had a doctor's appointment on Friday and he was back up to birthweight. He is eating good. His cord fell off but theres still a little scab there. Do I need to put anything on it?" RN reviewed cord care. "His circumcision is healing well and looks good. He sleeps in a bassinet." RN reviewed ABC's of safe sleep with patient. Patient declines any questions or concerns about baby. ? ?EPDS score is 2. ? Heron Nay ?05/02//2023,1514 ?

## 2021-12-09 DIAGNOSIS — Z0000 Encounter for general adult medical examination without abnormal findings: Secondary | ICD-10-CM | POA: Diagnosis not present

## 2021-12-20 DIAGNOSIS — O24419 Gestational diabetes mellitus in pregnancy, unspecified control: Secondary | ICD-10-CM | POA: Diagnosis not present

## 2021-12-20 DIAGNOSIS — Z0000 Encounter for general adult medical examination without abnormal findings: Secondary | ICD-10-CM | POA: Diagnosis not present

## 2022-01-27 DIAGNOSIS — M6281 Muscle weakness (generalized): Secondary | ICD-10-CM | POA: Diagnosis not present

## 2022-01-27 DIAGNOSIS — K5900 Constipation, unspecified: Secondary | ICD-10-CM | POA: Diagnosis not present

## 2022-01-27 DIAGNOSIS — R278 Other lack of coordination: Secondary | ICD-10-CM | POA: Diagnosis not present

## 2022-01-27 DIAGNOSIS — N393 Stress incontinence (female) (male): Secondary | ICD-10-CM | POA: Diagnosis not present

## 2022-05-16 DIAGNOSIS — D692 Other nonthrombocytopenic purpura: Secondary | ICD-10-CM | POA: Diagnosis not present

## 2022-05-16 DIAGNOSIS — D485 Neoplasm of uncertain behavior of skin: Secondary | ICD-10-CM | POA: Diagnosis not present

## 2022-05-16 DIAGNOSIS — D2262 Melanocytic nevi of left upper limb, including shoulder: Secondary | ICD-10-CM | POA: Diagnosis not present

## 2022-05-16 DIAGNOSIS — D225 Melanocytic nevi of trunk: Secondary | ICD-10-CM | POA: Diagnosis not present

## 2022-05-16 DIAGNOSIS — D2261 Melanocytic nevi of right upper limb, including shoulder: Secondary | ICD-10-CM | POA: Diagnosis not present

## 2022-05-16 DIAGNOSIS — D2272 Melanocytic nevi of left lower limb, including hip: Secondary | ICD-10-CM | POA: Diagnosis not present

## 2022-09-02 IMAGING — US US MFM FETAL BPP W/ NON-STRESS
1 series · 12 of 27 positions shown · non-contrast
Comparison: none

[Series 1: us mfm fetal bpp w/ non-stress · 27 acquisitions, 12 frames shown]
[im 2/27]
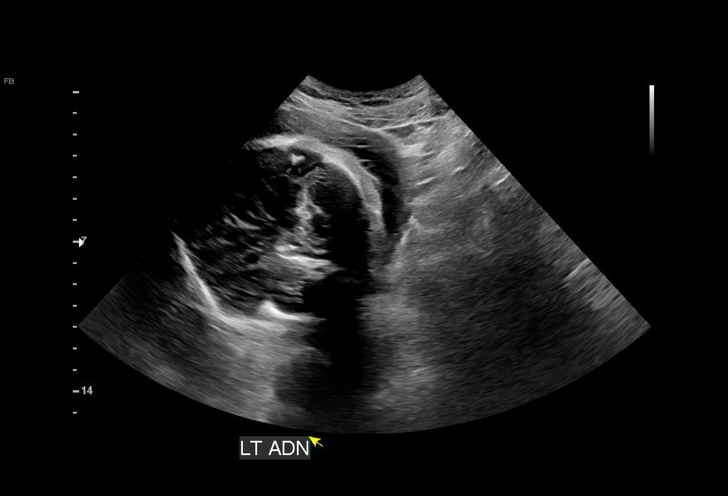
[im 4/27]
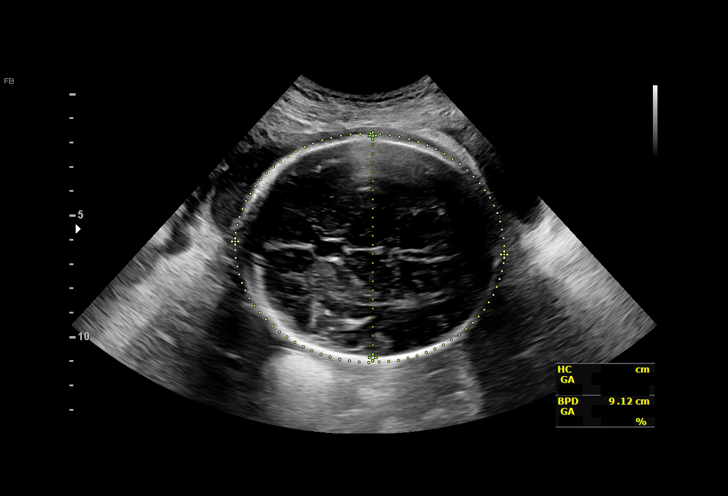
[im 6/27]
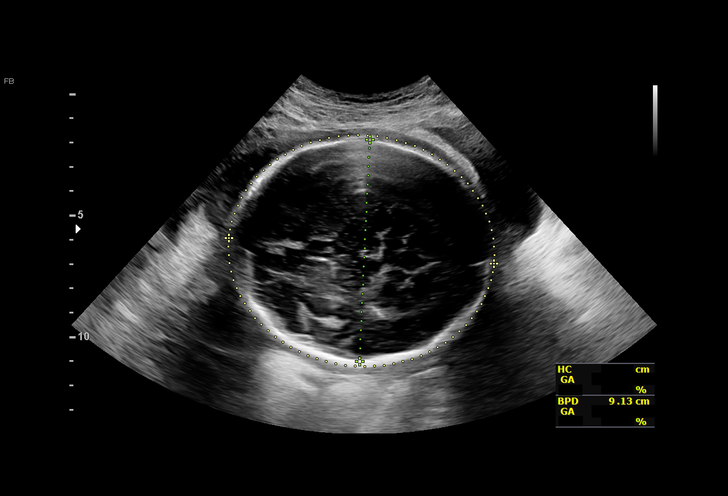
[im 8/27]
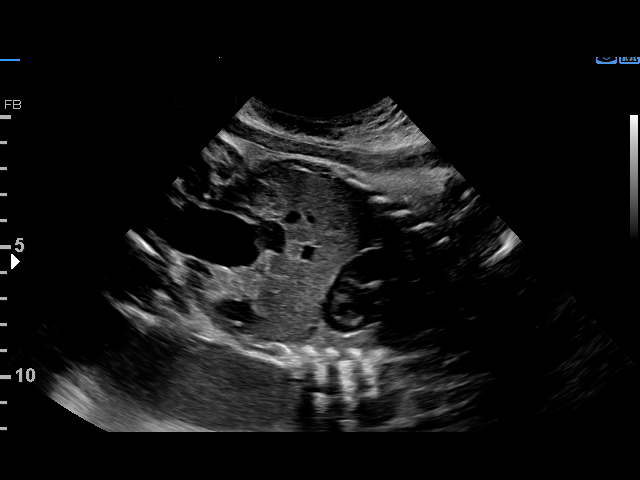
[im 11/27]
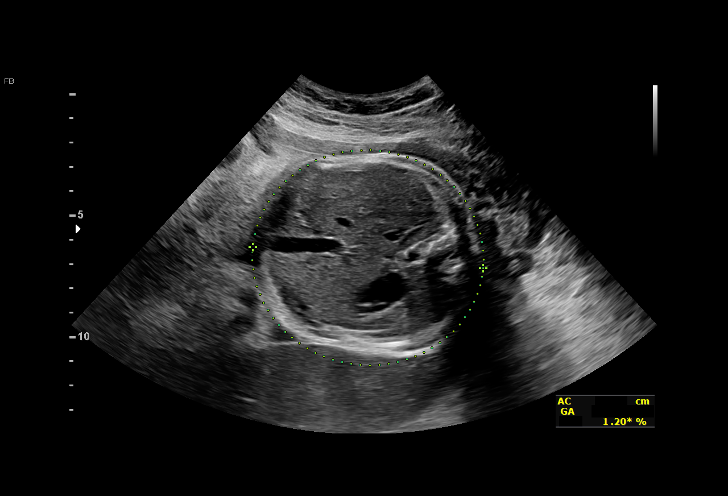
[im 13/27]
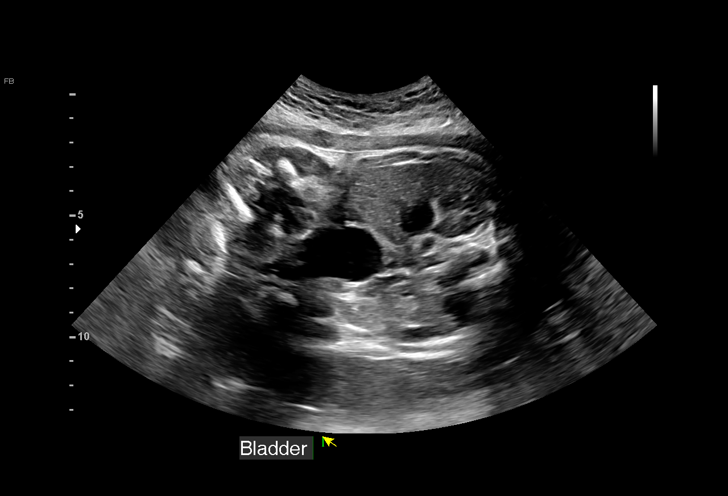
[im 15/27]
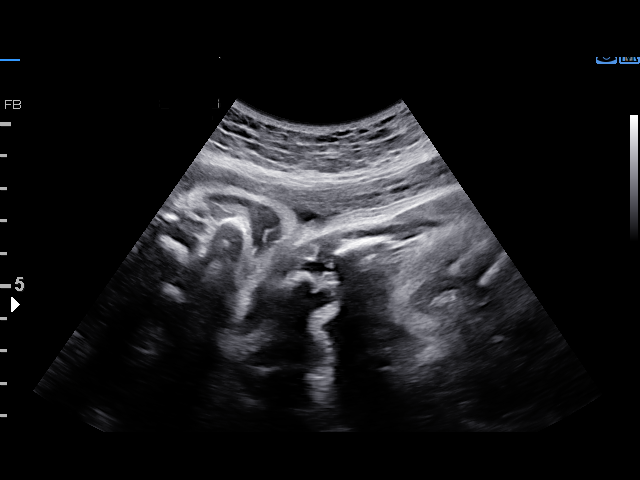
[im 17/27]
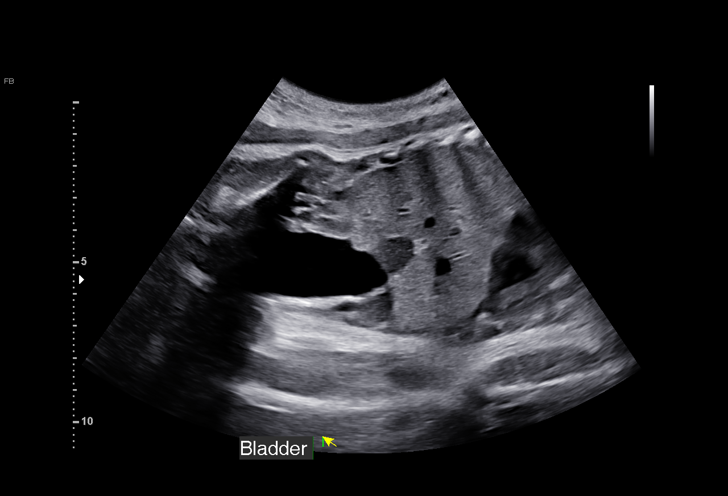
[im 20/27]
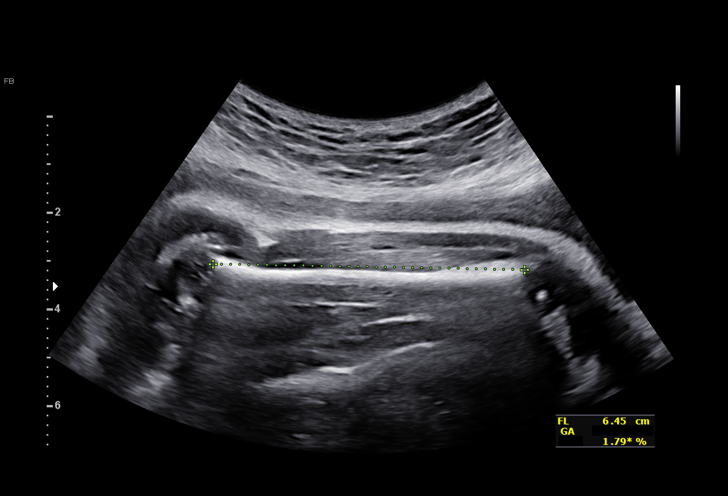
[im 22/27]
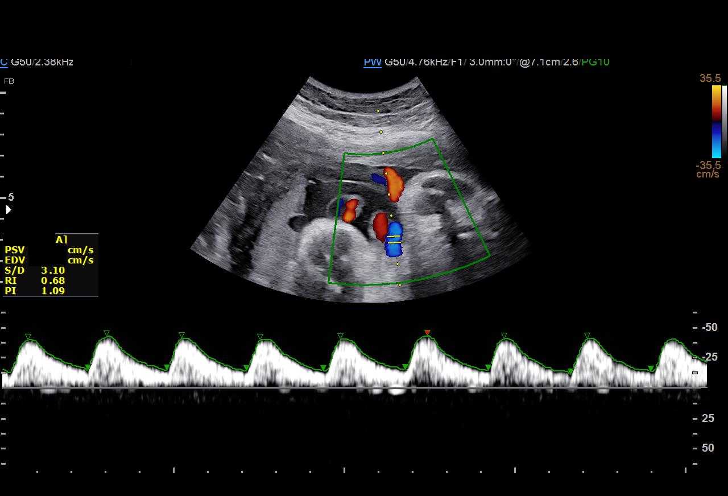
[im 24/27]
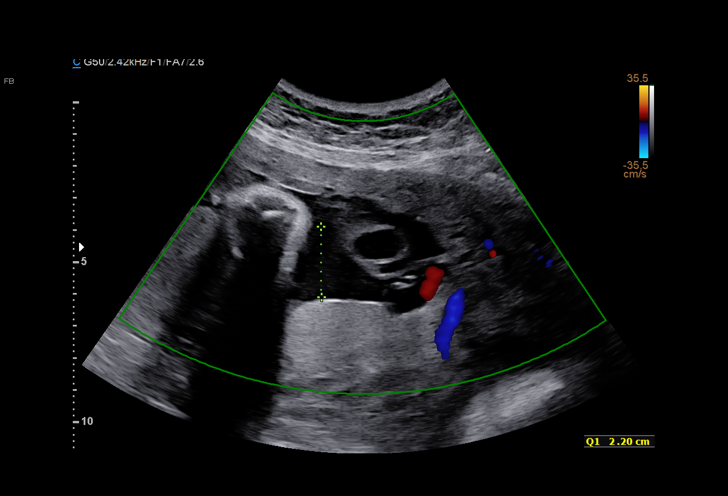
[im 26/27]
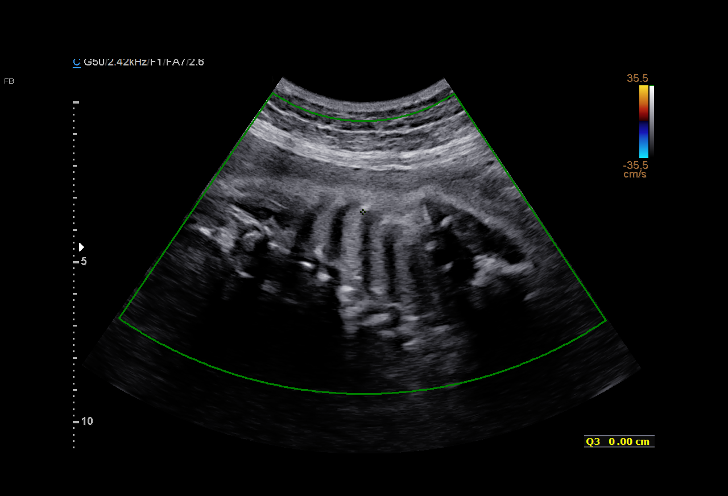

[12 of 27 positions shown; findings below may reference images not displayed]

RISSA
 2  US MFM UA CORD DOPPLER                76820.02    BRICE
                                                      RISSA
    REVOREDO/NONSTRESS                                       RISSA

Indications

 Maternal care for known or suspected poor
 fetal growth, third trimester, not applicable or
 unspecified IUGR
 Gestational hypertension without significant
 proteinuria, third trimester
 Gestational diabetes in pregnancy,
 controlled by oral hypoglycemic drugs
 (metformin)
 Velamentous insertion of umbilical cord
 Advanced maternal age primigravida 35+,
 third trimester
 Obesity complicating pregnancy, third
 trimester (pregravid BMI 31)
 36 weeks gestation of pregnancy
 LR NIPS, Neg AFP
Fetal Evaluation

 Num Of Fetuses:         1
 Fetal Heart Rate(bpm):  127
 Cardiac Activity:       Observed
 Presentation:           Cephalic
 Placenta:               Posterior
 P. Cord Insertion:      Velamentous ins prev seen
 Amniotic Fluid
 AFI FV:      Within normal limits

 AFI Sum(cm)     %Tile       Largest Pocket(cm)
 9.36            17

 RUQ(cm)       RLQ(cm)       LUQ(cm)        LLQ(cm)
 2.2           3.36          3.8            0
Biophysical Evaluation

 Amniotic F.V:   Pocket => 2 cm             F. Tone:        Observed
 F. Movement:    Observed                   N.S.T:          Reactive
 F. Breathing:   Observed                   Score:          [DATE]
Biometry

 BPD:      91.4  mm     G. Age:  37w 1d         83  %    CI:        80.53   %    70 - 86
                                                         FL/HC:      19.8   %    20.1 -
 HC:      321.7  mm     G. Age:  36w 2d         24  %    HC/AC:      1.12        0.93 -
 AC:      286.2  mm     G. Age:  32w 4d        < 1  %    FL/BPD:     69.8   %    71 - 87
 FL:       63.8  mm     G. Age:  33w 0d        < 1  %    FL/AC:      22.3   %    20 - 24

 Est. FW:    3352  gm    4 lb 14 oz     3.9  %
OB History

 Gravidity:    1
 Living:       0
Gestational Age

 LMP:           36w 1d        Date:  10/21/20                   EDD:   07/28/21
 U/S Today:     34w 5d                                        EDD:   08/07/21
 Best:          36w 1d     Det. By:  LMP  (10/21/20)          EDD:   07/28/21
Anatomy

 Cranium:               Appears normal         LVOT:                   Previously seen
 Cavum:                 Appears normal         Aortic Arch:            Previously seen
 Ventricles:            Previously seen        Ductal Arch:            Previously seen
 Choroid Plexus:        Previously seen        Diaphragm:              Previously seen
 Cerebellum:            Previously seen        Stomach:                Appears normal, left
                                                                       sided
 Posterior Fossa:       Previously seen        Abdomen:                Appears normal
 Nuchal Fold:           Not applicable (>20    Abdominal Wall:         Previously seen
                        wks GA)
 Face:                  Appears normal         Cord Vessels:           Previously seen
                        (orbits and profile)
 Lips:                  Previously seen        Kidneys:                Appear normal
 Palate:                Previously seen        Bladder:                Appears normal
 Thoracic:              Appears normal         Spine:                  Previously seen
 Heart:                 Previously seen        Upper Extremities:      Limited vis
                                                                       previously
 RVOT:                  Previously seen        Lower Extremities:      Limited vis
                                                                       previously

 Other:  Nasal bone, VC, 3VV and 3VTV previously visualized. Male gender
         previously seen.
Doppler - Fetal Vessels

 Umbilical Artery
  S/D     %tile      RI    %tile      PI    %tile            ADFV    RDFV
  2.93       80    0.66       84    1.09       91               No      No

Cervix Uterus Adnexa

 Adnexa
 No abnormality visualized.
Impression

 On today's ultrasound, the estimated fetal weight is at the 4th
 percentile.  Interval weight gain is 621 g.  Abdominal
 circumference measurement is at the 1st percentile.
 Amniotic fluid is normal and good fetal activity seen umbilical
 artery Doppler showed normal forward diastolic flow.  NST is
 reactive.  BPP [DATE]
 xxxxxxxxxxxxxxxxxxxxxxxxxxxxxxxxxxxxxxxxxxxxxxxxxxxxxxxxx
 x
 Consultation (see [REDACTED] )
 I had the [facility] of [facility] She is G1 P0 at 36w 1d gestation
 and is here for fetal growth assessment and antenatal testing.
 She was accompanied by her husband.
 Her pregnancy is complicated by gestational hypertension
 and severe fetal growth restriction. She takes labetalol.
 She is scheduled to undergo induction of labor on 07/10/2021.

 Blood pressures today at our office are 143/110, 143/111 and
 148/99 mmHg.

 Gestational hypertension with fetal growth restriction
 I discussed the significance of severe range blood pressures
 recorded at our office.  I counseled her on the possible
 complications of gestational hypertension including
 eclampsia, pulmonary edema and endorgan damage,
 coagulation disturbances and placental abruption.
 Patient reports that her blood pressures have been normal to
 mild hypertensive range at home.  She does not have signs
 symptoms of severe headache or visual disturbances or right
 upper quadrant pain or vaginal bleeding.
 I discussed the following options: a) delivery now because of
 severe range blood pressures and a fetal growth restriction,
 b) NASTOS evaluation for series of blood pressures to enable her
 provider to make decision.
 She would like to check her blood pressures at home and
 does not want to go to the [facility] now.
 I counseled her on the parameters of blood pressures and if
 severe range blood pressures i.e. systolic blood pressures at
 160 or greater and/or diastolic blood pressures at 110 or
 greater, she should go to [HOSPITAL].

 I discussed the findings and recommendations with Dr. Timofeeva.
Recommendations

 Delivery is indicated:
 -Recommended delivery now (patient opted not to be
 delivered now).
 - If she has severe hypertension or has symptoms of severe
 features of preeclampsia.
 -If increase in antihypertensive dosage is necessary.
                Tulloch, Kennija

## 2022-10-31 DIAGNOSIS — Z01419 Encounter for gynecological examination (general) (routine) without abnormal findings: Secondary | ICD-10-CM | POA: Diagnosis not present

## 2023-01-09 DIAGNOSIS — Z3200 Encounter for pregnancy test, result unknown: Secondary | ICD-10-CM | POA: Diagnosis not present

## 2023-01-09 DIAGNOSIS — Z3689 Encounter for other specified antenatal screening: Secondary | ICD-10-CM | POA: Diagnosis not present

## 2023-01-17 DIAGNOSIS — E785 Hyperlipidemia, unspecified: Secondary | ICD-10-CM | POA: Diagnosis not present

## 2023-01-17 DIAGNOSIS — I10 Essential (primary) hypertension: Secondary | ICD-10-CM | POA: Diagnosis not present

## 2023-01-17 DIAGNOSIS — E1169 Type 2 diabetes mellitus with other specified complication: Secondary | ICD-10-CM | POA: Diagnosis not present

## 2023-01-17 DIAGNOSIS — E538 Deficiency of other specified B group vitamins: Secondary | ICD-10-CM | POA: Diagnosis not present

## 2023-01-24 DIAGNOSIS — O24419 Gestational diabetes mellitus in pregnancy, unspecified control: Secondary | ICD-10-CM | POA: Diagnosis not present

## 2023-01-24 DIAGNOSIS — Z0000 Encounter for general adult medical examination without abnormal findings: Secondary | ICD-10-CM | POA: Diagnosis not present

## 2023-01-24 DIAGNOSIS — Z1331 Encounter for screening for depression: Secondary | ICD-10-CM | POA: Diagnosis not present

## 2023-01-24 DIAGNOSIS — Z1339 Encounter for screening examination for other mental health and behavioral disorders: Secondary | ICD-10-CM | POA: Diagnosis not present

## 2023-01-24 DIAGNOSIS — R82998 Other abnormal findings in urine: Secondary | ICD-10-CM | POA: Diagnosis not present

## 2023-02-07 DIAGNOSIS — Z3201 Encounter for pregnancy test, result positive: Secondary | ICD-10-CM | POA: Diagnosis not present

## 2023-02-20 DIAGNOSIS — Z1331 Encounter for screening for depression: Secondary | ICD-10-CM | POA: Diagnosis not present

## 2023-02-20 DIAGNOSIS — Z3689 Encounter for other specified antenatal screening: Secondary | ICD-10-CM | POA: Diagnosis not present

## 2023-02-20 DIAGNOSIS — Z3481 Encounter for supervision of other normal pregnancy, first trimester: Secondary | ICD-10-CM | POA: Diagnosis not present

## 2023-02-20 DIAGNOSIS — Z8759 Personal history of other complications of pregnancy, childbirth and the puerperium: Secondary | ICD-10-CM | POA: Diagnosis not present

## 2023-02-20 DIAGNOSIS — Z118 Encounter for screening for other infectious and parasitic diseases: Secondary | ICD-10-CM | POA: Diagnosis not present

## 2023-02-20 LAB — OB RESULTS CONSOLE HEPATITIS B SURFACE ANTIGEN: Hepatitis B Surface Ag: NEGATIVE

## 2023-02-20 LAB — OB RESULTS CONSOLE HIV ANTIBODY (ROUTINE TESTING): HIV: NONREACTIVE

## 2023-02-20 LAB — OB RESULTS CONSOLE RPR: RPR: NONREACTIVE

## 2023-02-20 LAB — OB RESULTS CONSOLE GC/CHLAMYDIA
Chlamydia: NEGATIVE
Neisseria Gonorrhea: NEGATIVE

## 2023-02-20 LAB — OB RESULTS CONSOLE RUBELLA ANTIBODY, IGM: Rubella: IMMUNE

## 2023-02-20 LAB — HEPATITIS C ANTIBODY: HCV Ab: NEGATIVE

## 2023-03-30 DIAGNOSIS — O139 Gestational [pregnancy-induced] hypertension without significant proteinuria, unspecified trimester: Secondary | ICD-10-CM | POA: Diagnosis not present

## 2023-03-30 DIAGNOSIS — Z361 Encounter for antenatal screening for raised alphafetoprotein level: Secondary | ICD-10-CM | POA: Diagnosis not present

## 2023-03-30 DIAGNOSIS — Z8632 Personal history of gestational diabetes: Secondary | ICD-10-CM | POA: Diagnosis not present

## 2023-05-03 DIAGNOSIS — O09522 Supervision of elderly multigravida, second trimester: Secondary | ICD-10-CM | POA: Diagnosis not present

## 2023-05-03 DIAGNOSIS — Z3A20 20 weeks gestation of pregnancy: Secondary | ICD-10-CM | POA: Diagnosis not present

## 2023-06-27 DIAGNOSIS — Z3689 Encounter for other specified antenatal screening: Secondary | ICD-10-CM | POA: Diagnosis not present

## 2023-06-30 DIAGNOSIS — Z3A28 28 weeks gestation of pregnancy: Secondary | ICD-10-CM | POA: Diagnosis not present

## 2023-06-30 DIAGNOSIS — O368199 Decreased fetal movements, unspecified trimester, other fetus: Secondary | ICD-10-CM | POA: Diagnosis not present

## 2023-07-25 DIAGNOSIS — Z23 Encounter for immunization: Secondary | ICD-10-CM | POA: Diagnosis not present

## 2023-07-25 DIAGNOSIS — Z8759 Personal history of other complications of pregnancy, childbirth and the puerperium: Secondary | ICD-10-CM | POA: Diagnosis not present

## 2023-08-02 DIAGNOSIS — M25552 Pain in left hip: Secondary | ICD-10-CM | POA: Diagnosis not present

## 2023-08-02 DIAGNOSIS — M9903 Segmental and somatic dysfunction of lumbar region: Secondary | ICD-10-CM | POA: Diagnosis not present

## 2023-08-02 DIAGNOSIS — M9904 Segmental and somatic dysfunction of sacral region: Secondary | ICD-10-CM | POA: Diagnosis not present

## 2023-08-02 DIAGNOSIS — M62830 Muscle spasm of back: Secondary | ICD-10-CM | POA: Diagnosis not present

## 2023-08-02 DIAGNOSIS — M4727 Other spondylosis with radiculopathy, lumbosacral region: Secondary | ICD-10-CM | POA: Diagnosis not present

## 2023-08-10 DIAGNOSIS — M25552 Pain in left hip: Secondary | ICD-10-CM | POA: Diagnosis not present

## 2023-08-10 DIAGNOSIS — M9904 Segmental and somatic dysfunction of sacral region: Secondary | ICD-10-CM | POA: Diagnosis not present

## 2023-08-10 DIAGNOSIS — M4727 Other spondylosis with radiculopathy, lumbosacral region: Secondary | ICD-10-CM | POA: Diagnosis not present

## 2023-08-10 DIAGNOSIS — M9903 Segmental and somatic dysfunction of lumbar region: Secondary | ICD-10-CM | POA: Diagnosis not present

## 2023-08-18 DIAGNOSIS — M4727 Other spondylosis with radiculopathy, lumbosacral region: Secondary | ICD-10-CM | POA: Diagnosis not present

## 2023-08-18 DIAGNOSIS — M9904 Segmental and somatic dysfunction of sacral region: Secondary | ICD-10-CM | POA: Diagnosis not present

## 2023-08-18 DIAGNOSIS — M25552 Pain in left hip: Secondary | ICD-10-CM | POA: Diagnosis not present

## 2023-08-18 DIAGNOSIS — M9903 Segmental and somatic dysfunction of lumbar region: Secondary | ICD-10-CM | POA: Diagnosis not present

## 2023-08-22 DIAGNOSIS — O321XX0 Maternal care for breech presentation, not applicable or unspecified: Secondary | ICD-10-CM | POA: Diagnosis not present

## 2023-08-22 DIAGNOSIS — Z3A36 36 weeks gestation of pregnancy: Secondary | ICD-10-CM | POA: Diagnosis not present

## 2023-08-22 DIAGNOSIS — Z3685 Encounter for antenatal screening for Streptococcus B: Secondary | ICD-10-CM | POA: Diagnosis not present

## 2023-08-23 DIAGNOSIS — M4727 Other spondylosis with radiculopathy, lumbosacral region: Secondary | ICD-10-CM | POA: Diagnosis not present

## 2023-08-23 DIAGNOSIS — M25552 Pain in left hip: Secondary | ICD-10-CM | POA: Diagnosis not present

## 2023-08-23 DIAGNOSIS — M9904 Segmental and somatic dysfunction of sacral region: Secondary | ICD-10-CM | POA: Diagnosis not present

## 2023-08-23 DIAGNOSIS — M9903 Segmental and somatic dysfunction of lumbar region: Secondary | ICD-10-CM | POA: Diagnosis not present

## 2023-08-28 ENCOUNTER — Other Ambulatory Visit: Payer: Self-pay | Admitting: Obstetrics and Gynecology

## 2023-08-28 ENCOUNTER — Encounter (HOSPITAL_COMMUNITY): Payer: Self-pay

## 2023-08-28 DIAGNOSIS — O321XX0 Maternal care for breech presentation, not applicable or unspecified: Secondary | ICD-10-CM | POA: Diagnosis not present

## 2023-08-28 DIAGNOSIS — Z3A37 37 weeks gestation of pregnancy: Secondary | ICD-10-CM | POA: Diagnosis not present

## 2023-08-29 ENCOUNTER — Encounter (HOSPITAL_COMMUNITY): Payer: Self-pay

## 2023-08-29 ENCOUNTER — Telehealth (HOSPITAL_COMMUNITY): Payer: Self-pay | Admitting: *Deleted

## 2023-08-29 NOTE — Telephone Encounter (Signed)
 Preadmission screen

## 2023-08-30 ENCOUNTER — Telehealth (HOSPITAL_COMMUNITY): Payer: Self-pay | Admitting: *Deleted

## 2023-08-30 ENCOUNTER — Encounter (HOSPITAL_COMMUNITY): Payer: Self-pay

## 2023-08-30 DIAGNOSIS — M9904 Segmental and somatic dysfunction of sacral region: Secondary | ICD-10-CM | POA: Diagnosis not present

## 2023-08-30 DIAGNOSIS — M25552 Pain in left hip: Secondary | ICD-10-CM | POA: Diagnosis not present

## 2023-08-30 DIAGNOSIS — M9903 Segmental and somatic dysfunction of lumbar region: Secondary | ICD-10-CM | POA: Diagnosis not present

## 2023-08-30 DIAGNOSIS — M4727 Other spondylosis with radiculopathy, lumbosacral region: Secondary | ICD-10-CM | POA: Diagnosis not present

## 2023-08-30 NOTE — Telephone Encounter (Signed)
 Preadmission screen

## 2023-08-30 NOTE — Patient Instructions (Signed)
 Jamie Acosta  08/30/2023   Your procedure is scheduled on:  09/11/2023  Arrive at 0800 at Entrance C on [address] at [facility]  and [employer]. You are invited to use the FREE valet parking or use the Visitor's parking deck.  Pick up the phone at the desk and dial [phone].  Call this number if you have problems the morning of surgery: [phone]  Remember:   Do not eat food:(After Midnight) Desps de medianoche.  You may drink clear liquids until  _0600____.  Clear liquids means a liquid you can see thru.  It can have color such as Cola or Kool aid.  Tea is OK and coffee as long as no milk or creamer of any kind.  Take these medicines the morning of surgery with A SIP OF WATER:  none   Do not wear jewelry, make-up or nail polish.  Do not wear lotions, powders, or perfumes. Do not wear deodorant.  Do not shave 48 hours prior to surgery.  Do not bring valuables to the hospital.  [facility] is not   responsible for any belongings or valuables brought to the hospital.  Contacts, dentures or bridgework may not be worn into surgery.  Leave suitcase in the car. After surgery it may be brought to your room.  For patients admitted to the hospital, checkout time is 11:00 AM the day of              discharge.      Please read over the following fact sheets that you were given:     Preparing for Surgery

## 2023-09-07 DIAGNOSIS — O321XX0 Maternal care for breech presentation, not applicable or unspecified: Secondary | ICD-10-CM | POA: Diagnosis not present

## 2023-09-07 DIAGNOSIS — Z3A38 38 weeks gestation of pregnancy: Secondary | ICD-10-CM | POA: Diagnosis not present

## 2023-09-08 ENCOUNTER — Encounter (HOSPITAL_COMMUNITY)
Admission: RE | Admit: 2023-09-08 | Discharge: 2023-09-08 | Disposition: A | Discharge status: Home / Self Care | Source: Ambulatory Visit | Attending: Obstetrics and Gynecology | Admitting: Obstetrics and Gynecology

## 2023-09-08 DIAGNOSIS — Z3A39 39 weeks gestation of pregnancy: Secondary | ICD-10-CM | POA: Insufficient documentation

## 2023-09-08 DIAGNOSIS — Z01818 Encounter for other preprocedural examination: Secondary | ICD-10-CM | POA: Insufficient documentation

## 2023-09-08 LAB — TYPE AND SCREEN
ABO/RH(D): A POS
Antibody Screen: NEGATIVE

## 2023-09-08 LAB — RPR: RPR Ser Ql: NONREACTIVE

## 2023-09-08 LAB — CBC
HCT: 39 % (ref 36.0–46.0)
Hemoglobin: 13.3 g/dL (ref 12.0–15.0)
MCH: 31 pg (ref 26.0–34.0)
MCHC: 34.1 g/dL (ref 30.0–36.0)
MCV: 90.9 fL (ref 80.0–100.0)
Platelets: 165 10*3/uL (ref 150–400)
RBC: 4.29 MIL/uL (ref 3.87–5.11)
RDW: 13.5 % (ref 11.5–15.5)
WBC: 10.3 10*3/uL (ref 4.0–10.5)
nRBC: 0 % (ref 0.0–0.2)

## 2023-09-11 ENCOUNTER — Inpatient Hospital Stay (HOSPITAL_COMMUNITY): Admitting: Anesthesiology

## 2023-09-11 ENCOUNTER — Inpatient Hospital Stay (HOSPITAL_COMMUNITY)
Admission: RE | Admit: 2023-09-11 | Discharge: 2023-09-13 | DRG: 788 | Disposition: A | Discharge status: Home / Self Care | Attending: Obstetrics and Gynecology | Admitting: Obstetrics and Gynecology

## 2023-09-11 ENCOUNTER — Encounter (HOSPITAL_COMMUNITY): Payer: Self-pay | Admitting: Obstetrics and Gynecology

## 2023-09-11 ENCOUNTER — Encounter (HOSPITAL_COMMUNITY)
Admission: RE | Disposition: A | Discharge status: Home / Self Care | Payer: Self-pay | Source: Home / Self Care | Attending: Obstetrics and Gynecology

## 2023-09-11 ENCOUNTER — Other Ambulatory Visit: Payer: Self-pay

## 2023-09-11 DIAGNOSIS — Z98891 History of uterine scar from previous surgery: Secondary | ICD-10-CM

## 2023-09-11 DIAGNOSIS — Z3490 Encounter for supervision of normal pregnancy, unspecified, unspecified trimester: Secondary | ICD-10-CM

## 2023-09-11 DIAGNOSIS — O321XX0 Maternal care for breech presentation, not applicable or unspecified: Secondary | ICD-10-CM | POA: Diagnosis present

## 2023-09-11 DIAGNOSIS — O24419 Gestational diabetes mellitus in pregnancy, unspecified control: Principal | ICD-10-CM

## 2023-09-11 DIAGNOSIS — Z3A39 39 weeks gestation of pregnancy: Secondary | ICD-10-CM | POA: Diagnosis not present

## 2023-09-11 DIAGNOSIS — O134 Gestational [pregnancy-induced] hypertension without significant proteinuria, complicating childbirth: Secondary | ICD-10-CM | POA: Diagnosis not present

## 2023-09-11 DIAGNOSIS — Z8249 Family history of ischemic heart disease and other diseases of the circulatory system: Secondary | ICD-10-CM | POA: Diagnosis not present

## 2023-09-11 DIAGNOSIS — O99214 Obesity complicating childbirth: Secondary | ICD-10-CM | POA: Diagnosis present

## 2023-09-11 SURGERY — Surgical Case
Anesthesia: Spinal

## 2023-09-11 MED ORDER — ACETAMINOPHEN 500 MG PO TABS
1000.0000 mg | ORAL_TABLET | Freq: Once | ORAL | Status: AC
  Administered 2023-09-11: 1000 mg via ORAL

## 2023-09-11 MED ORDER — SODIUM CHLORIDE 0.9 % IV SOLN: 12.5000 mg | INTRAVENOUS | Status: DC | PRN

## 2023-09-11 MED ORDER — CHLORHEXIDINE GLUCONATE 0.12 % MT SOLN
15.0000 mL | Freq: Once | OROMUCOSAL | Status: AC
  Administered 2023-09-11: 15 mL via OROMUCOSAL

## 2023-09-11 MED ORDER — PRENATAL MULTIVITAMIN CH
1.0000 | ORAL_TABLET | Freq: Every day | ORAL | Status: DC
  Administered 2023-09-11 – 2023-09-13 (×3): 1 via ORAL
  Filled 2023-09-11 (×3): qty 1

## 2023-09-11 MED ORDER — MORPHINE SULFATE (PF) 0.5 MG/ML IJ SOLN
INTRAMUSCULAR | Status: DC | PRN
  Administered 2023-09-11: 150 ug via INTRATHECAL

## 2023-09-11 MED ORDER — DEXAMETHASONE SODIUM PHOSPHATE 10 MG/ML IJ SOLN
INTRAMUSCULAR | Status: AC
  Filled 2023-09-11: qty 1

## 2023-09-11 MED ORDER — OXYCODONE HCL 5 MG PO TABS
5.0000 mg | ORAL_TABLET | ORAL | Status: DC | PRN
  Administered 2023-09-12 – 2023-09-13 (×2): 5 mg via ORAL
  Filled 2023-09-11 (×2): qty 1

## 2023-09-11 MED ORDER — ONDANSETRON HCL 4 MG/2ML IJ SOLN: 4.0000 mg | Freq: Three times a day (TID) | INTRAMUSCULAR | Status: DC | PRN

## 2023-09-11 MED ORDER — NALOXONE HCL 4 MG/10ML IJ SOLN: 1.0000 ug/kg/h | INTRAVENOUS | Status: DC | PRN

## 2023-09-11 MED ORDER — OXYCODONE HCL 5 MG/5ML PO SOLN: 5.0000 mg | Freq: Once | ORAL | Status: DC | PRN

## 2023-09-11 MED ORDER — OXYCODONE HCL 5 MG PO TABS: 5.0000 mg | ORAL_TABLET | Freq: Once | ORAL | Status: DC | PRN

## 2023-09-11 MED ORDER — FENTANYL CITRATE (PF) 100 MCG/2ML IJ SOLN
INTRAMUSCULAR | Status: DC | PRN
  Administered 2023-09-11: 15 ug via INTRATHECAL

## 2023-09-11 MED ORDER — FAMOTIDINE 20 MG PO TABS
20.0000 mg | ORAL_TABLET | Freq: Once | ORAL | Status: AC
  Administered 2023-09-11: 20 mg via ORAL

## 2023-09-11 MED ORDER — SODIUM CHLORIDE 0.9 % IR SOLN
Status: DC | PRN
  Administered 2023-09-11: 1

## 2023-09-11 MED ORDER — FAMOTIDINE 20 MG PO TABS
ORAL_TABLET | ORAL | Status: AC
  Filled 2023-09-11: qty 1

## 2023-09-11 MED ORDER — ACETAMINOPHEN 500 MG PO TABS
1000.0000 mg | ORAL_TABLET | Freq: Four times a day (QID) | ORAL | Status: DC
  Administered 2023-09-11 – 2023-09-13 (×8): 1000 mg via ORAL
  Filled 2023-09-11 (×8): qty 2

## 2023-09-11 MED ORDER — ACETAMINOPHEN 500 MG PO TABS
ORAL_TABLET | ORAL | Status: AC
  Filled 2023-09-11: qty 2

## 2023-09-11 MED ORDER — STERILE WATER FOR IRRIGATION IR SOLN
Status: DC | PRN
  Administered 2023-09-11: 1000 mL

## 2023-09-11 MED ORDER — CEFAZOLIN SODIUM-DEXTROSE 2-4 GM/100ML-% IV SOLN
INTRAVENOUS | Status: AC
  Filled 2023-09-11: qty 100

## 2023-09-11 MED ORDER — SIMETHICONE 80 MG PO CHEW
80.0000 mg | CHEWABLE_TABLET | ORAL | Status: DC | PRN
  Administered 2023-09-13: 80 mg via ORAL
  Filled 2023-09-11: qty 1

## 2023-09-11 MED ORDER — FENTANYL CITRATE (PF) 100 MCG/2ML IJ SOLN: 25.0000 ug | INTRAMUSCULAR | Status: DC | PRN

## 2023-09-11 MED ORDER — ONDANSETRON HCL 4 MG/2ML IJ SOLN
INTRAMUSCULAR | Status: DC | PRN
  Administered 2023-09-11: 4 mg via INTRAVENOUS

## 2023-09-11 MED ORDER — DIPHENHYDRAMINE HCL 25 MG PO CAPS: 25.0000 mg | ORAL_CAPSULE | ORAL | Status: DC | PRN

## 2023-09-11 MED ORDER — SCOPOLAMINE 1 MG/3DAYS TD PT72: 1.0000 | MEDICATED_PATCH | Freq: Once | TRANSDERMAL | Status: DC

## 2023-09-11 MED ORDER — ORAL CARE MOUTH RINSE: 15.0000 mL | Freq: Once | OROMUCOSAL | Status: AC

## 2023-09-11 MED ORDER — MORPHINE SULFATE (PF) 0.5 MG/ML IJ SOLN
INTRAMUSCULAR | Status: AC
  Filled 2023-09-11: qty 10

## 2023-09-11 MED ORDER — OXYTOCIN-SODIUM CHLORIDE 30-0.9 UT/500ML-% IV SOLN: 2.5000 [IU]/h | INTRAVENOUS | Status: AC

## 2023-09-11 MED ORDER — PHENYLEPHRINE HCL-NACL 20-0.9 MG/250ML-% IV SOLN
INTRAVENOUS | Status: AC
  Filled 2023-09-11: qty 250

## 2023-09-11 MED ORDER — COCONUT OIL OIL: 1.0000 | TOPICAL_OIL | Status: DC | PRN

## 2023-09-11 MED ORDER — HYDROMORPHONE HCL 1 MG/ML IJ SOLN: 0.2000 mg | INTRAMUSCULAR | Status: DC | PRN

## 2023-09-11 MED ORDER — NALOXONE HCL 0.4 MG/ML IJ SOLN: 0.4000 mg | INTRAMUSCULAR | Status: DC | PRN

## 2023-09-11 MED ORDER — KETOROLAC TROMETHAMINE 30 MG/ML IJ SOLN: 30.0000 mg | Freq: Four times a day (QID) | INTRAMUSCULAR | Status: AC | PRN

## 2023-09-11 MED ORDER — OXYTOCIN-SODIUM CHLORIDE 30-0.9 UT/500ML-% IV SOLN
INTRAVENOUS | Status: AC
  Filled 2023-09-11: qty 500

## 2023-09-11 MED ORDER — SOD CITRATE-CITRIC ACID 500-334 MG/5ML PO SOLN
ORAL | Status: AC
  Filled 2023-09-11: qty 30

## 2023-09-11 MED ORDER — SIMETHICONE 80 MG PO CHEW
80.0000 mg | CHEWABLE_TABLET | Freq: Three times a day (TID) | ORAL | Status: DC
  Administered 2023-09-11 – 2023-09-13 (×7): 80 mg via ORAL
  Filled 2023-09-11 (×7): qty 1

## 2023-09-11 MED ORDER — LACTATED RINGERS IV SOLN: INTRAVENOUS | Status: DC

## 2023-09-11 MED ORDER — SODIUM CHLORIDE 0.9% FLUSH: 3.0000 mL | INTRAVENOUS | Status: DC | PRN

## 2023-09-11 MED ORDER — AMISULPRIDE (ANTIEMETIC) 5 MG/2ML IV SOLN: 10.0000 mg | Freq: Once | INTRAVENOUS | Status: DC | PRN

## 2023-09-11 MED ORDER — KETOROLAC TROMETHAMINE 30 MG/ML IJ SOLN
30.0000 mg | Freq: Four times a day (QID) | INTRAMUSCULAR | Status: AC
  Administered 2023-09-11 – 2023-09-12 (×4): 30 mg via INTRAVENOUS
  Filled 2023-09-11 (×4): qty 1

## 2023-09-11 MED ORDER — ONDANSETRON HCL 4 MG/2ML IJ SOLN
INTRAMUSCULAR | Status: AC
  Filled 2023-09-11: qty 2

## 2023-09-11 MED ORDER — CEFAZOLIN SODIUM-DEXTROSE 2-4 GM/100ML-% IV SOLN
2.0000 g | INTRAVENOUS | Status: AC
  Administered 2023-09-11: 2 g via INTRAVENOUS

## 2023-09-11 MED ORDER — ZOLPIDEM TARTRATE 5 MG PO TABS: 5.0000 mg | ORAL_TABLET | Freq: Every evening | ORAL | Status: DC | PRN

## 2023-09-11 MED ORDER — MEPERIDINE HCL 25 MG/ML IJ SOLN: 6.2500 mg | INTRAMUSCULAR | Status: DC | PRN

## 2023-09-11 MED ORDER — PHENYLEPHRINE HCL-NACL 20-0.9 MG/250ML-% IV SOLN
INTRAVENOUS | Status: DC | PRN
  Administered 2023-09-11: 40 ug/min via INTRAVENOUS

## 2023-09-11 MED ORDER — OXYTOCIN-SODIUM CHLORIDE 30-0.9 UT/500ML-% IV SOLN
INTRAVENOUS | Status: DC | PRN
  Administered 2023-09-11: 300 mL via INTRAVENOUS

## 2023-09-11 MED ORDER — DEXAMETHASONE SODIUM PHOSPHATE 10 MG/ML IJ SOLN
INTRAMUSCULAR | Status: DC | PRN
  Administered 2023-09-11: 10 mg via INTRAVENOUS

## 2023-09-11 MED ORDER — DIPHENHYDRAMINE HCL 25 MG PO CAPS: 25.0000 mg | ORAL_CAPSULE | Freq: Four times a day (QID) | ORAL | Status: DC | PRN

## 2023-09-11 MED ORDER — CHLORHEXIDINE GLUCONATE 0.12 % MT SOLN
OROMUCOSAL | Status: AC
  Filled 2023-09-11: qty 15

## 2023-09-11 MED ORDER — BUPIVACAINE IN DEXTROSE 0.75-8.25 % IT SOLN
INTRATHECAL | Status: DC | PRN
  Administered 2023-09-11: 1.6 mL via INTRATHECAL

## 2023-09-11 MED ORDER — SCOPOLAMINE 1 MG/3DAYS TD PT72
1.0000 | MEDICATED_PATCH | Freq: Once | TRANSDERMAL | Status: DC
  Administered 2023-09-11: 1.5 mg via TRANSDERMAL

## 2023-09-11 MED ORDER — SCOPOLAMINE 1 MG/3DAYS TD PT72
MEDICATED_PATCH | TRANSDERMAL | Status: AC
Start: 2023-09-11 — End: 2023-09-11
  Filled 2023-09-11: qty 1

## 2023-09-11 MED ORDER — ACETAMINOPHEN 160 MG/5ML PO SOLN
960.0000 mg | Freq: Once | ORAL | Status: AC
Start: 2023-09-11 — End: 2023-09-11

## 2023-09-11 MED ORDER — SOD CITRATE-CITRIC ACID 500-334 MG/5ML PO SOLN
30.0000 mL | Freq: Once | ORAL | Status: AC
Start: 2023-09-11 — End: 2023-09-11
  Administered 2023-09-11: 30 mL via ORAL

## 2023-09-11 MED ORDER — DIPHENHYDRAMINE HCL 50 MG/ML IJ SOLN
12.5000 mg | INTRAMUSCULAR | Status: DC | PRN
Start: 2023-09-11 — End: 2023-09-11

## 2023-09-11 MED ORDER — MENTHOL 3 MG MT LOZG
1.0000 | LOZENGE | OROMUCOSAL | Status: DC | PRN
Start: 2023-09-11 — End: 2023-09-13

## 2023-09-11 MED ORDER — IBUPROFEN 600 MG PO TABS
600.0000 mg | ORAL_TABLET | Freq: Four times a day (QID) | ORAL | Status: DC
Start: 2023-09-12 — End: 2023-09-13
  Administered 2023-09-12 – 2023-09-13 (×4): 600 mg via ORAL
  Filled 2023-09-11 (×4): qty 1

## 2023-09-11 MED ORDER — POVIDONE-IODINE 10 % EX SWAB
2.0000 | Freq: Once | CUTANEOUS | Status: AC
Start: 2023-09-11 — End: 2023-09-11
  Administered 2023-09-11: 2 via TOPICAL

## 2023-09-11 MED ORDER — FENTANYL CITRATE (PF) 100 MCG/2ML IJ SOLN
INTRAMUSCULAR | Status: AC
Start: 2023-09-11 — End: 2023-09-11
  Filled 2023-09-11: qty 2

## 2023-09-11 MED ORDER — SENNOSIDES-DOCUSATE SODIUM 8.6-50 MG PO TABS
2.0000 | ORAL_TABLET | Freq: Every day | ORAL | Status: DC
Start: 2023-09-12 — End: 2023-09-13
  Administered 2023-09-12 – 2023-09-13 (×2): 2 via ORAL
  Filled 2023-09-11 (×2): qty 2

## 2023-09-11 SURGICAL SUPPLY — 27 items
BENZOIN TINCTURE PRP APPL 2/3 (GAUZE/BANDAGES/DRESSINGS) IMPLANT
CHLORAPREP W/TINT 26 (MISCELLANEOUS) ×2 IMPLANT
CLAMP UMBILICAL CORD (MISCELLANEOUS) ×1 IMPLANT
CLOTH BEACON ORANGE TIMEOUT ST (SAFETY) ×1 IMPLANT
DRSG OPSITE POSTOP 4X10 (GAUZE/BANDAGES/DRESSINGS) ×1 IMPLANT
ELECTRODE REM PT RTRN 9FT ADLT (ELECTROSURGICAL) ×1 IMPLANT
EXTRACTOR VACUUM KIWI (MISCELLANEOUS) IMPLANT
GLOVE BIOGEL PI IND STRL 7.0 (GLOVE) ×3 IMPLANT
GLOVE ECLIPSE 6.5 STRL STRAW (GLOVE) ×1 IMPLANT
GOWN STRL REUS W/TWL LRG LVL3 (GOWN DISPOSABLE) ×2 IMPLANT
KIT ABG SYR 3ML LUER SLIP (SYRINGE) IMPLANT
LIGASURE IMPACT 36 18CM CVD LR (INSTRUMENTS) ×1 IMPLANT
MAT PREVALON FULL STRYKER (MISCELLANEOUS) IMPLANT
NDL HYPO 25X5/8 SAFETYGLIDE (NEEDLE) IMPLANT
NEEDLE HYPO 25X5/8 SAFETYGLIDE (NEEDLE) IMPLANT
NS IRRIG 1000ML POUR BTL (IV SOLUTION) ×1 IMPLANT
PACK C SECTION WH (CUSTOM PROCEDURE TRAY) ×1 IMPLANT
PAD OB MATERNITY 4.3X12.25 (PERSONAL CARE ITEMS) ×1 IMPLANT
STRIP CLOSURE SKIN 1/2X4 (GAUZE/BANDAGES/DRESSINGS) IMPLANT
SUT MNCRL 0 VIOLET CTX 36 (SUTURE) ×2 IMPLANT
SUT PLAIN 0 NONE (SUTURE) IMPLANT
SUT PLAIN ABS 2-0 CT1 27XMFL (SUTURE) ×1 IMPLANT
SUT VIC AB 0 CT1 27XBRD ANBCTR (SUTURE) ×1 IMPLANT
SUT VIC AB 4-0 KS 27 (SUTURE) ×1 IMPLANT
TOWEL OR 17X24 6PK STRL BLUE (TOWEL DISPOSABLE) ×1 IMPLANT
TRAY FOLEY W/BAG SLVR 14FR LF (SET/KITS/TRAYS/PACK) IMPLANT
WATER STERILE IRR 1000ML POUR (IV SOLUTION) ×1 IMPLANT

## 2023-09-11 NOTE — Transfer of Care (Signed)
 Immediate Anesthesia Transfer of Care Note  Patient: Jamie Acosta  Procedure(s) Performed: CESAREAN DELIVERY  Patient Location: PACU  Anesthesia Type:Spinal  Level of Consciousness: awake  Airway & Oxygen Therapy: Patient Spontanous Breathing  Post-op Assessment: Report given to RN and Post -op Vital signs reviewed and stable  Post vital signs: Reviewed and stable  Last Vitals:  Vitals Value Taken Time  BP 130/99 09/11/23 12:06  Temp    Pulse 79 09/11/23 12:08  Resp 15 09/11/23 12:08  SpO2 100 % 09/11/23 12:08  Vitals shown include unfiled device data.  Last Pain:  Vitals:   09/11/23 0919  TempSrc: Oral         Complications: No notable events documented.

## 2023-09-11 NOTE — Anesthesia Preprocedure Evaluation (Addendum)
 Anesthesia Evaluation  Patient identified by MRN, date of birth, ID band Patient awake    Reviewed: Allergy & Precautions, NPO status , Patient's Chart, lab work & pertinent test results  History of Anesthesia Complications Negative for: history of anesthetic complications  Airway Mallampati: II   Neck ROM: Full    Dental   Pulmonary neg pulmonary ROS   Pulmonary exam normal        Cardiovascular negative cardio ROS Normal cardiovascular exam     Neuro/Psych negative neurological ROS  negative psych ROS   GI/Hepatic negative GI ROS, Neg liver ROS,,,  Endo/Other  diabetes, Well Controlled, Gestational   Obesity   Renal/GU negative Renal ROS     Musculoskeletal negative musculoskeletal ROS (+)    Abdominal   Peds  Hematology negative hematology ROS (+)  Plt 165k    Anesthesia Other Findings   Reproductive/Obstetrics (+) Pregnancy  Hx PIH with prior pregnancy                              Anesthesia Physical Anesthesia Plan  ASA: 2  Anesthesia Plan: Spinal   Post-op Pain Management: Tylenol  PO (pre-op)*   Induction:   PONV Risk Score and Plan: 2 and Treatment may vary due to age or medical condition, Ondansetron  and Scopolamine patch - Pre-op  Airway Management Planned: Natural Airway  Additional Equipment: None  Intra-op Plan:   Post-operative Plan:   Informed Consent: I have reviewed the patients History and Physical, chart, labs and discussed the procedure including the risks, benefits and alternatives for the proposed anesthesia with the patient or authorized representative who has indicated his/her understanding and acceptance.       Plan Discussed with: CRNA and Anesthesiologist  Anesthesia Plan Comments: (Labs reviewed, platelets acceptable. Discussed risks and benefits of spinal, including spinal/epidural hematoma, infection, failed block, and PDPH. Patient  expressed understanding and wished to proceed. )       Anesthesia Quick Evaluation

## 2023-09-11 NOTE — H&P (Signed)
 Jamie Acosta is a 38 y.o. female G2P1001 [redacted]w[redacted]d presenting for primary cesarean section for breech  Prenatal care at [facility]. Pregnancy dated by LMP c/w 8w sono. Routine PNL WNL. She had a low risk NIPS with negative AFP1 and normal fetal anatomy scan. Normal 1hr GTT 132 and Hgb 11.6 at third trimester screening. Growth US  performed at 36.1 showed breech presentation with EFW 6#10 (67%) and fundal/posterior placenta. Persistent breech presentation since then and declined ECV. GBS screen negative. AMA but otherwise healthy.   Patient a/b her husband 'Justin' and they are anticipating a baby boy 'Alexander'  OB History     Gravida  2   Para  1   Term  1   Preterm      AB      Living  1      SAB      IAB      Ectopic      Multiple  0   Live Births  1          Past Medical History:  Diagnosis Date   Gestational diabetes    Pregnancy induced hypertension    Seasonal allergies    Velamentous insertion of umbilical cord    Past Surgical History:  Procedure Laterality Date   WISDOM TOOTH EXTRACTION Bilateral    Family History: family history includes COPD in her paternal grandmother; Hypertension in her father; Pancreatic cancer in her father; Seizures in her sister. Social History:  reports that she has never smoked. She does not have any smokeless tobacco history on file. She reports that she does not currently use alcohol . She reports that she does not use drugs.     Maternal Diabetes: No Genetic Screening: Normal Maternal Ultrasounds/Referrals: Normal Fetal Ultrasounds or other Referrals:  None Maternal Substance Abuse:  No Significant Maternal Medications:  None Significant Maternal Lab Results:  Group B Strep negative Other Comments:  None  Review of Systems  All other systems reviewed and are negative.  Per HPI Exam Physical Exam Vitals reviewed.  Constitutional:      Appearance: She is normal weight.   Cardiovascular:     Rate and  Rhythm: Normal rate.  Pulmonary:     Effort: Pulmonary effort is normal.  Abdominal:     Tenderness: There is no abdominal tenderness.   Musculoskeletal:        General: Normal range of motion.     Cervical back: Normal range of motion.   Skin:    General: Skin is warm and dry.   Neurological:     General: No focal deficit present.     Mental Status: She is alert and oriented to person, place, and time.   Psychiatric:        Mood and Affect: Mood normal.        Behavior: Behavior normal.       Blood pressure (!) 135/103, pulse 85, temperature 97.6 F (36.4 C), resp. rate 18, height 5' 1 (1.549 m), weight 82.4 kg, SpO2 99%, unknown if currently breastfeeding.  Prenatal labs: ABO, Rh:  --/--/A POS (06/20 1005) Antibody: NEG (06/20 1005) Rubella: Immune (12/02 0000) RPR: NON REACTIVE (06/20 1003)  HBsAg: Negative (12/02 0000)  HIV: Non-reactive (12/02 0000)  GBS:   Negative 08/22/23  ChemistryNo results for input(s): NA, K, CL, CO2, GLUCOSE, BUN, CREATININE, CALCIUM, GFRNONAA, GFRAA, ANIONGAP in the last 168 hours.  No results for input(s): PROT, ALBUMIN, AST, ALT, ALKPHOS, BILITOT in the last 168 hours. Hematology Recent  Labs  Lab 09/08/23 1003  WBC 10.3  RBC 4.29  HGB 13.3  HCT 39.0  MCV 90.9  MCH 31.0  MCHC 34.1  RDW 13.5  PLT 165   Cardiac EnzymesNo results for input(s): TROPONINI in the last 168 hours. No results for input(s): TROPIPOC in the last 168 hours.  BNPNo results for input(s): BNP, PROBNP in the last 168 hours.  DDimer No results for input(s): DDIMER in the last 168 hours.   Assessment/Plan: Jamie Acosta is a 38 y.o. female G2P1001 [redacted]w[redacted]d admitted for primary cesarean section for breech  Patient consented both in the office and again in the preoperative holding unit for cesarean section. She has been counseled on the surgical risks including but not limited to bleeding, infection, damage to  surrounding structures, VTE, and inherit risks of anesthesia. She has been counseled on and declines alternative of ECV. She has been consented for blood transfusion if clinically indicated. She declines tubal sterilization at time of cesarean. She verbalizes good understanding of surgical risks, questions answered, and consents signed.   -Admit to OR -NPO  -Ancef 2 g IV abx ppx -SCD VTE ppx -Routine preop/postop care   Darick Fetters A Shelley Pooley 09/11/2023, 9:34 AM

## 2023-09-11 NOTE — Anesthesia Postprocedure Evaluation (Signed)
 Anesthesia Post Note  Patient: Jamie Acosta  Procedure(s) Performed: CESAREAN DELIVERY     Patient location during evaluation: PACU Anesthesia Type: Spinal Level of consciousness: awake and alert Pain management: pain level controlled Vital Signs Assessment: post-procedure vital signs reviewed and stable Respiratory status: spontaneous breathing and respiratory function stable Cardiovascular status: blood pressure returned to baseline and stable Postop Assessment: spinal receding and no apparent nausea or vomiting Anesthetic complications: no   No notable events documented.  Last Vitals:  Vitals:   09/11/23 1250 09/11/23 1300  BP:    Pulse: 73 65  Resp: 13 17  Temp:    SpO2: 99% 99%    Last Pain:  Vitals:   09/11/23 1235  TempSrc: Oral   Pain Goal:    LLE Motor Response: Purposeful movement (09/11/23 1305) LLE Sensation: Tingling (09/11/23 1305) RLE Motor Response: Purposeful movement (09/11/23 1305) RLE Sensation: Tingling (09/11/23 1305)     Epidural/Spinal Function Cutaneous sensation: No Sensation (09/11/23 1250), Patient able to flex knees: No (09/11/23 1250), Patient able to lift hips off bed: No (09/11/23 1250), Back pain beyond tenderness at insertion site: No (09/11/23 1250), Progressively worsening motor and/or sensory loss: No (09/11/23 1250), Bowel and/or bladder incontinence post epidural: No (09/11/23 1250)  Debby FORBES Like

## 2023-09-11 NOTE — Anesthesia Procedure Notes (Signed)
 Spinal  Patient location during procedure: OR Start time: 09/11/2023 10:51 AM End time: 09/11/2023 10:54 AM Reason for block: surgical anesthesia Staffing Performed: anesthesiologist  Anesthesiologist: Lucious Debby BRAVO, MD Performed by: Lucious Debby BRAVO, MD Authorized by: Lucious Debby BRAVO, MD   Preanesthetic Checklist Completed: patient identified, IV checked, risks and benefits discussed, surgical consent, monitors and equipment checked, pre-op evaluation and timeout performed Spinal Block Patient position: sitting Prep: DuraPrep Patient monitoring: heart rate, cardiac monitor, continuous pulse ox and blood pressure Approach: midline Location: L3-4 Injection technique: single-shot Needle Needle type: Pencan  Needle gauge: 24 G Additional Notes Consent was obtained prior to the procedure with all questions answered and concerns addressed. Risks including, but not limited to, bleeding, infection, nerve damage, paralysis, failed block, inadequate analgesia, allergic reaction, high spinal, itching, and headache were discussed and the patient wished to proceed. Functioning IV was confirmed and monitors were applied. Sterile prep and drape, including hand hygiene, mask, and sterile gloves were used. The patient was positioned and the spine was prepped. The skin was anesthetized with lidocaine . Free flow of clear CSF was obtained prior to injecting local anesthetic into the CSF. The spinal needle aspirated freely following injection. The needle was carefully withdrawn. The patient tolerated the procedure well.   Debby Lucious, MD

## 2023-09-11 NOTE — Op Note (Signed)
 Jamie Acosta 13-Jul-1985 [id number]  OPERATIVE NOTE  PROCEDURE: primary low transverse cesarean section  PRE-OPERATIVE DIAGNOSIS:  Single intrauterine pregnancy at 39 weeks 0 days Breech  POST-OPERATIVE DIAGNOSIS: As above, delivered  SURGEON: Jamoni Broadfoot, DO  ASSISTANT: Alan Molt, CNM  FINDINGS: single viable female infant in the frank breech presentation apgars 8 and 9, weight 8 pounds 0 ounces with double nuchal cord. Normal appearing female gravida anatomy including uterus with smooth contours and bilateral fallopian tubes and ovaries  EBL: 481 cc  FLUIDS: 2,400 cc LR  URINE OUTPUT:  200 cc  COMPLICATIONS: none   PROCEDURE IN DETAIL:  After the patient was appropriately consented she was taken to the operating room where regional anesthesia was obtained without complications. The patient was placed in the dorsal supine position with leftward tilt. Fetal heart tones were obtained and found to be reassuring. A Foley catheter was placed and the bladder was drained for clear yellow urine and remained in place for the duration of the procedure. The patient was prepped and draped in the usual sterile fashion. An appropriate time out was performed that verified the correct patient, procedure, and surgical team.   The scalpel was used to make a low transverse skin incision. The incision was carried down to the fascia, maintaining hemostasis with the Bovie as needed, The fascia was incised to the left and the right of the midline. The fascia incision was carried laterally using the curved Mayo scissors on either side. The inferior aspect of the fascia was grasped with the Kocher clamps, tented upwards, and the underlying rectus muscle dissected off bluntly and sharply with curved Mayo scissors. The Kocher clamps were removed, placed on the superior aspect of the fascia and the rectus muscles dissected off in a similar fashion. The rectus muscles were divided at the midline bluntly. The  peritoneum was identified, grasped with hemostat clamps and entered sharply with the Metzenbaum scissors. The incision was then extended laterally by bluntly stretching. The Alexis retractor was introduced. The vesicouterine peritoneum was dissected off the lower uterine segment and a bladder flap was created digitally. The scalpel was used to make a low transverse uterine incision. The incision was extended caudad and cephalad by bluntly stretching. The amniotic membranes were ruptured for clear fluid. The infant was found to be in the frank breech presentation. The sacrum was delivered first followed by prompt delivery of the fetal body, shoulders and head, double nuchal cord reduced. The infant was dried, stimulated, and handed off to the awaiting neonatal team after 60 seconds of delayed cord clamping. The placenta was delivered intact by manual extraction. The uterine cavity was cleared of any clot and debris. The hysterotomy was closed using 0-Monocryl in a running locking fashion. A second layer closure was performed using the same suture. Adequate hemostasis of the hysterotomy was noted. The bilateral gutters were cleared of all clot and debris. Bilateral tubes and ovaries were inspected and found to be within normal limits. The underlying fascia planes were inspected and found to be hemostatic. The fascia was closed with 0-Vicryl a normal running fashion. The subcutaneous layer was irrigated with sterile saline and hemostasis obtained with the Bovie. The subcutaneous layer was closed with 2-0 plain. The skin layer was closed with 4-0 Vicryl. The patient tolerated the procedure well and was taken to the recovery room in stable condition. All instrument, needle, and sponge counts were correct.   09/11/23 12:31 PM Jamie Acosta

## 2023-09-12 ENCOUNTER — Other Ambulatory Visit: Payer: Self-pay

## 2023-09-12 ENCOUNTER — Encounter (HOSPITAL_COMMUNITY): Payer: Self-pay | Admitting: Obstetrics and Gynecology

## 2023-09-12 DIAGNOSIS — O321XX0 Maternal care for breech presentation, not applicable or unspecified: Secondary | ICD-10-CM | POA: Diagnosis present

## 2023-09-12 LAB — CBC
HCT: 32 % — ABNORMAL LOW (ref 36.0–46.0)
Hemoglobin: 10.8 g/dL — ABNORMAL LOW (ref 12.0–15.0)
MCH: 30.6 pg (ref 26.0–34.0)
MCHC: 33.8 g/dL (ref 30.0–36.0)
MCV: 90.7 fL (ref 80.0–100.0)
Platelets: 169 10*3/uL (ref 150–400)
RBC: 3.53 MIL/uL — ABNORMAL LOW (ref 3.87–5.11)
RDW: 13.4 % (ref 11.5–15.5)
WBC: 13.8 10*3/uL — ABNORMAL HIGH (ref 4.0–10.5)
nRBC: 0 % (ref 0.0–0.2)

## 2023-09-12 LAB — BIRTH TISSUE RECOVERY COLLECTION (PLACENTA DONATION)

## 2023-09-12 MED ORDER — NIFEDIPINE ER OSMOTIC RELEASE 30 MG PO TB24
30.0000 mg | ORAL_TABLET | Freq: Every day | ORAL | Status: DC
  Administered 2023-09-12 – 2023-09-13 (×2): 30 mg via ORAL
  Filled 2023-09-12 (×2): qty 1

## 2023-09-12 MED ORDER — DIBUCAINE (PERIANAL) 1 % EX OINT: 1.0000 | TOPICAL_OINTMENT | CUTANEOUS | Status: DC | PRN

## 2023-09-12 MED ORDER — WITCH HAZEL-GLYCERIN EX PADS: 1.0000 | MEDICATED_PAD | CUTANEOUS | Status: DC | PRN

## 2023-09-12 NOTE — Progress Notes (Signed)
   Subjective: POD# 1 Live born female  Birth Weight: 7 lb 15.7 oz (3620 g) APGAR: 8, 9  Newborn Delivery   Birth date/time: 09/11/2023 11:16:00 Delivery type: C-Section, Low Transverse Trial of labor: No C-section categorization: Primary    Baby name: Marsa  Delivering provider: RENDELL RUBY A  Circumcision Planning prior to discharge  Feeding: pumping for baby, plans to exclusively pump  Pain control at delivery: Spinal  Reports feeling well with minimal pain.   Patient reports tolerating PO.   Pain controlled with prescription NSAID's including ketorolac (Toradol) Denies HA/SOB/C/P/N/V/dizziness. She reports vaginal bleeding as normal, without clots. She is ambulating and urinating without difficulty.     Objective:   Vitals:   09/11/23 1736 09/11/23 1914 09/11/23 2254 09/12/23 0454  BP: 123/85 120/87 122/87 103/71  Pulse: 63 70 65 61  Resp: 16 16 16 16   Temp: (!) 97.5 F (36.4 C) 98.3 F (36.8 C) 98.3 F (36.8 C) 98.3 F (36.8 C)  TempSrc:  Oral Oral Oral  SpO2: 99% 98% 98% 98%  Weight:      Height:        Intake/Output Summary (Last 24 hours) at 09/12/2023 0905 Last data filed at 09/12/2023 0240 Gross per 24 hour  Intake 2400 ml  Output 3981 ml  Net -1581 ml      Recent Labs    09/12/23 0649  WBC 13.8*  HGB 10.8*  HCT 32.0*  PLT 169    Blood type: --/--/A POS (06/20 1005)  Rubella: Immune (12/02 0000)   Physical Exam:  General: alert and cooperative CV: Regular rate and rhythm Resp: clear Abdomen: soft, nontender, normal bowel sounds Incision: clean, dry, and intact Uterine Fundus: firm, below umbilicus, nontender Lochia: minimal Ext: extremities normal, atraumatic, no cyanosis or edema  Assessment/Plan: 38 y.o.   POD# 1. [id number]                  Principal Problem:   Postpartum care following cesarean delivery 6/23  Encourage rest when baby rests Breastfeeding support Encourage to ambulate Routine post-op care Active  Problems:   Status post primary low transverse cesarean section - breech  Anticipate discharge home tomorrow on POD #2.            Alan MARLA Molt, CNM, MSN 09/12/2023, 9:05 AM

## 2023-09-12 NOTE — Lactation Note (Signed)
 This note was copied from a baby's chart. Lactation Consultation Note  Patient Name: Jamie Acosta Date: 09/12/2023 Age:40 hours  Mom declines Lactation services.   Maternal Data    Feeding Nipple Type: Slow - flow  LATCH Score                    Lactation Tools Discussed/Used    Interventions    Discharge    Consult Status Consult Status: Complete    Torina Ey G 09/12/2023, 12:17 AM

## 2023-09-12 NOTE — Progress Notes (Signed)
 RN called w/ BPs Patient Vitals for the past 24 hrs:  BP Temp Temp src Pulse Resp SpO2  09/12/23 2040 (!) 131/96 98.6 F (37 C) Axillary 69 18 99 %  09/12/23 1652 (!) 123/94 -- -- 69 -- --  09/12/23 1535 128/89 97.9 F (36.6 C) Oral 75 17 100 %  09/12/23 1530 (!) 125/90 -- -- 74 -- --  09/12/23 0454 103/71 98.3 F (36.8 C) Oral 61 16 98 %  09/11/23 2254 122/87 98.3 F (36.8 C) Oral 65 16 98 %    Asymptomatic, start Procardia  30mg  XL and labs in AM

## 2023-09-13 LAB — CBC
HCT: 30.9 % — ABNORMAL LOW (ref 36.0–46.0)
Hemoglobin: 10.3 g/dL — ABNORMAL LOW (ref 12.0–15.0)
MCH: 30.3 pg (ref 26.0–34.0)
MCHC: 33.3 g/dL (ref 30.0–36.0)
MCV: 90.9 fL (ref 80.0–100.0)
Platelets: 153 10*3/uL (ref 150–400)
RBC: 3.4 MIL/uL — ABNORMAL LOW (ref 3.87–5.11)
RDW: 13.6 % (ref 11.5–15.5)
WBC: 10.4 10*3/uL (ref 4.0–10.5)
nRBC: 0 % (ref 0.0–0.2)

## 2023-09-13 LAB — COMPREHENSIVE METABOLIC PANEL WITH GFR
ALT: 11 U/L (ref 0–44)
AST: 26 U/L (ref 15–41)
Albumin: 2.5 g/dL — ABNORMAL LOW (ref 3.5–5.0)
Alkaline Phosphatase: 99 U/L (ref 38–126)
Anion gap: 7 (ref 5–15)
BUN: 7 mg/dL (ref 6–20)
CO2: 22 mmol/L (ref 22–32)
Calcium: 8.2 mg/dL — ABNORMAL LOW (ref 8.9–10.3)
Chloride: 107 mmol/L (ref 98–111)
Creatinine, Ser: 0.65 mg/dL (ref 0.44–1.00)
GFR, Estimated: >60 mL/min (ref >60)
Glucose, Bld: 95 mg/dL (ref 70–99)
Potassium: 3.6 mmol/L (ref 3.5–5.1)
Sodium: 136 mmol/L (ref 135–145)
Total Bilirubin: <0.2 mg/dL (ref 0.0–1.2)
Total Protein: 4.8 g/dL — ABNORMAL LOW (ref 6.5–8.1)

## 2023-09-13 MED ORDER — IBUPROFEN 600 MG PO TABS: 600.0000 mg | ORAL_TABLET | Freq: Four times a day (QID) | ORAL | 0 refills | Status: AC

## 2023-09-13 MED ORDER — OXYCODONE HCL 5 MG PO TABS: 5.0000 mg | ORAL_TABLET | Freq: Four times a day (QID) | ORAL | 0 refills | Status: AC | PRN

## 2023-09-13 MED ORDER — ACETAMINOPHEN 500 MG PO TABS: 1000.0000 mg | ORAL_TABLET | Freq: Four times a day (QID) | ORAL | 0 refills | Status: AC

## 2023-09-13 MED ORDER — NIFEDIPINE ER 30 MG PO TB24
30.0000 mg | ORAL_TABLET | Freq: Every day | ORAL | 1 refills | Status: AC
Start: 2023-09-14 — End: ?

## 2023-09-13 NOTE — Progress Notes (Addendum)
 POD# 2  Live born female  Birth Weight: 7 lb 15.7 oz (3620 g) APGAR: 8, 9 Baby name: Alexander Circumcision by Dr Rendell this afternoon  Feeding: pumping for baby, plans to exclusively pump  Patient reports tolerating PO.   Pain controlled with prescription NSAID's including ketorolac (Toradol) Denies HA/SOB/C/P/N/V/dizziness. She reports vaginal bleeding as normal, without clots. She is ambulating and urinating without difficulty.     H/o GTHN and needed med in 1st preg. BPs nl this pregnancy. Now elevated, pt asymptomatic, pt says BP nl w/ manual cuff last pregnancy. But accepts antiHTN med. Prefers d/c home today, can monitor at home, goal BP <135/ <85    Objective:   Vitals:   09/12/23 1535 09/12/23 1652 09/12/23 2040 09/13/23 0546  BP: 128/89 (!) 123/94 (!) 131/96 125/86  Pulse: 75 69 69 86  Resp: 17  18 18   Temp: 97.9 F (36.6 C)  98.6 F (37 C) 98 F (36.7 C)  TempSrc: Oral  Axillary Axillary  SpO2: 100%  99%   Weight:      Height:          Latest Ref Rng & Units 09/13/2023    4:57 AM 09/12/2023    6:49 AM 09/08/2023   10:03 AM  CBC  WBC 4.0 - 10.5 K/uL 10.4  13.8  10.3   Hemoglobin 12.0 - 15.0 g/dL 89.6  89.1  86.6   Hematocrit 36.0 - 46.0 % 30.9  32.0  39.0   Platelets 150 - 400 K/uL 153  169  165        Latest Ref Rng & Units 09/13/2023    4:57 AM 07/11/2021    2:22 PM 07/10/2021   12:38 PM  CMP  Glucose 70 - 99 mg/dL 95  856  82   BUN 6 - 20 mg/dL 7  7  9    Creatinine 0.44 - 1.00 mg/dL 9.34  9.30  9.33   Sodium 135 - 145 mmol/L 136  135  136   Potassium 3.5 - 5.1 mmol/L 3.6  3.8  4.3   Chloride 98 - 111 mmol/L 107  105  105   CO2 22 - 32 mmol/L 22  23  22    Calcium 8.9 - 10.3 mg/dL 8.2  8.7  9.6   Total Protein 6.5 - 8.1 g/dL 4.8  4.7  5.7   Total Bilirubin 0.0 - 1.2 mg/dL <9.7  0.3  0.6   Alkaline Phos 38 - 126 U/L 99  72  91   AST 15 - 41 U/L 26  27  24    ALT 0 - 44 U/L 11  18  19       Blood type: --/--/A POS (06/20 1005)  Rubella: Immune (12/02 0000)    Physical Exam:  General: alert and cooperative CV: Regular rate and rhythm Resp: clear Abdomen: soft, nontender, normal bowel sounds Incision: clean, dry, and intact Uterine Fundus: firm, below umbilicus, nontender Lochia: minimal Ext: extremities normal, atraumatic, no cyanosis or edema  Assessment/Plan: 38 y.o.   POD# 2. [id number]  PP elevated BPs - Procardia  30mg  XL started last night. Will continue to this AM and continue to assess BP with next check.  D/c home w/ Procardia  and f/up 6/30 Dr Rendell in office for BP check.                 PP care/ PEC ss/ call parameters dw pt. Pt is aware and accepts and will call as needed   Kwadwo Taras R  Lynae Pederson, MD

## 2023-09-13 NOTE — Discharge Summary (Signed)
 Postpartum Discharge Summary  Patient Name: Jamie Acosta DOB: 1985-04-02 MRN: [id number]  Date of admission: 09/11/2023 Delivery date:09/11/2023 Delivering provider: RENDELL RUBY A Date of discharge: 09/13/2023  Admitting diagnosis: Breech presentation  Intrauterine pregnancy: [redacted]w[redacted]d     Secondary diagnosis:  Status post primary low transverse cesarean section - breech  Additional problems: Elevated BPs postpartum with history of gestational HTN in 1st pregnancy      Discharge diagnosis: Term Pregnancy Delivered and Gestational Hypertension postpartum                                        Post partum procedures:none Augmentation: N/A Complications: None  Hospital course: Sceduled C/S   38 y.o. yo G2P2002 at [redacted]w[redacted]d was admitted to the hospital 09/11/2023 for scheduled cesarean section with the following indication:Malpresentation.Delivery details are as follows:  Membrane Rupture Time/Date: 11:16 AM,09/11/2023  Delivery Method:C-Section, Low Transverse Operative Delivery:N/A Details of operation can be found in separate operative note.  Patient had a postpartum course complicated byelevated BPs. Patient Vitals for the past 24 hrs:  BP Temp Temp src Pulse Resp SpO2  09/13/23 1007 (!) 139/99 97.7 F (36.5 C) Axillary 94 16 --  09/13/23 0546 125/86 98 F (36.7 C) Axillary 86 18 --  09/12/23 2040 (!) 131/96 98.6 F (37 C) Axillary 69 18 99 %  09/12/23 1652 (!) 123/94 -- -- 69 -- --  09/12/23 1535 128/89 97.9 F (36.6 C) Oral 75 17 100 %  09/12/23 1530 (!) 125/90 -- -- 74 -- --  Procardia  30mg  XL started on 6/24 and another dose given on 6/25 morning for AM dosing  Pt asymptomatic.  She is ambulating, tolerating a regular diet, passing flatus, and urinating well. Patient is discharged home in stable condition on  09/13/23 per mom request and she'll monitor BP at home, call if >140/90 or if headache, swelling, vision changes. Upper belly pain. Not feeling well         Newborn  Data: Birth date:09/11/2023 Birth time:11:16 AM Gender:Female Living status:Living Apgars:8 ,9  Weight:3620 g    Magnesium  Sulfate received: No BMZ received: No Rhophylac:N/A MMR:N/A T-DaP:Given prenatally Flu: No RSV Vaccine received: No Transfusion:no Immunizations administered: There is no immunization history for the selected administration types on file for this patient.  Physical exam  Vitals:   09/12/23 1652 09/12/23 2040 09/13/23 0546 09/13/23 1007  BP: (!) 123/94 (!) 131/96 125/86 (!) 139/99  Pulse: 69 69 86 94  Resp:  18 18 16   Temp:  98.6 F (37 C) 98 F (36.7 C) 97.7 F (36.5 C)  TempSrc:  Axillary Axillary Axillary  SpO2:  99%    Weight:      Height:       General: alert, cooperative, and no distress Lochia: appropriate Uterine Fundus: firm Incision: Healing well with no significant drainage, No significant erythema DVT Evaluation: No evidence of DVT seen on physical exam. Negative Homan's sign. Labs: Lab Results  Component Value Date   WBC 10.4 09/13/2023   HGB 10.3 (L) 09/13/2023   HCT 30.9 (L) 09/13/2023   MCV 90.9 09/13/2023   PLT 153 09/13/2023      Latest Ref Rng & Units 09/13/2023    4:57 AM  CMP  Glucose 70 - 99 mg/dL 95   BUN 6 - 20 mg/dL 7   Creatinine 9.55 - 8.99 mg/dL 9.34   Sodium 864 -  145 mmol/L 136   Potassium 3.5 - 5.1 mmol/L 3.6   Chloride 98 - 111 mmol/L 107   CO2 22 - 32 mmol/L 22   Calcium 8.9 - 10.3 mg/dL 8.2   Total Protein 6.5 - 8.1 g/dL 4.8   Total Bilirubin 0.0 - 1.2 mg/dL <9.7   Alkaline Phos 38 - 126 U/L 99   AST 15 - 41 U/L 26   ALT 0 - 44 U/L 11    Edinburgh Score:    09/12/2023    8:27 AM  Edinburgh Postnatal Depression Scale Screening Tool  I have been able to laugh and see the funny side of things. 0  I have looked forward with enjoyment to things. 0  I have blamed myself unnecessarily when things went wrong. 1  I have been anxious or worried for no good reason. 0  I have felt scared or panicky for no  good reason. 0  Things have been getting on top of me. 0  I have been so unhappy that I have had difficulty sleeping. 0  I have felt sad or miserable. 0  I have been so unhappy that I have been crying. 0  The thought of harming myself has occurred to me. 0  Edinburgh Postnatal Depression Scale Total 1      After visit meds:  Allergies as of 09/13/2023   No Known Allergies      Medication List     STOP taking these medications    aspirin EC 81 MG tablet       TAKE these medications    acetaminophen  500 MG tablet Commonly known as: TYLENOL  Take 2 tablets (1,000 mg total) by mouth every 6 (six) hours.   cetirizine 10 MG tablet Commonly known as: ZYRTEC Take 10 mg by mouth daily.   ibuprofen  600 MG tablet Commonly known as: ADVIL  Take 1 tablet (600 mg total) by mouth every 6 (six) hours.   NIFEdipine  30 MG 24 hr tablet Commonly known as: ADALAT  CC Take 1 tablet (30 mg total) by mouth daily. Start taking on: September 14, 2023   oxyCODONE 5 MG immediate release tablet Commonly known as: Oxy IR/ROXICODONE Take 1 tablet (5 mg total) by mouth every 6 (six) hours as needed for up to 10 days for moderate pain (pain score 4-6).   prenatal vitamin w/FE, FA 27-1 MG Tabs tablet Take 1 tablet by mouth daily.   PROBIOTIC DAILY PO Take 1 capsule by mouth daily.               Discharge Care Instructions  (From admission, onward)           Start     Ordered   09/13/23 0000  Discharge wound care:       Comments: Remove dressing on 09/16/23   09/13/23 1101             Discharge home in stable condition Infant Feeding: Breast Infant Disposition:home with mother Discharge instruction: per After Visit Summary and Postpartum booklet. Activity: Advance as tolerated. Pelvic rest for 6 weeks.  Diet: routine diet Anticipated Birth Control: Unsure Postpartum Appointment: on 09/18/23 with Dr Rendell or any doctor in office  Additional Postpartum F/U: 6 weeks  Future  Appointments:No future appointments. Follow up Visit:  Follow-up Information     Barbette Knock, MD Follow up in 6 week(s).   Specialty: Obstetrics and Gynecology Contact information: [address] [place] [address] [phone]  09/13/2023 Robbi JONELLE Render, MD

## 2023-09-18 DIAGNOSIS — O165 Unspecified maternal hypertension, complicating the puerperium: Secondary | ICD-10-CM | POA: Diagnosis not present

## 2023-09-21 ENCOUNTER — Telehealth (HOSPITAL_COMMUNITY): Payer: Self-pay | Admitting: *Deleted

## 2023-09-21 NOTE — Telephone Encounter (Signed)
 09/21/2023  Name: Karianna Gusman MRN: [id number] DOB: 09/14/1985  Reason for Call:  Transition of Care Hospital Discharge Call  Contact Status: Patient Contact Status: Message  Language assistant needed:          Follow-Up Questions:    Van Postnatal Depression Scale:  In the Past 7 Days:    PHQ2-9 Depression Scale:     Discharge Follow-up:    Post-discharge interventions: NA  Mliss Sieve, RN 09/21/2023 13:07

## 2023-10-25 DIAGNOSIS — Z124 Encounter for screening for malignant neoplasm of cervix: Secondary | ICD-10-CM | POA: Diagnosis not present

## 2023-10-25 DIAGNOSIS — Z1331 Encounter for screening for depression: Secondary | ICD-10-CM | POA: Diagnosis not present

## 2024-01-19 NOTE — Therapy (Addendum)
 OUTPATIENT PHYSICAL THERAPY FEMALE PELVIC EVALUATION   Patient Name: Jamie Acosta MRN: [id number] DOB:30-Oct-1985, 38 y.o., female Today's Date: 01/23/2024  END OF SESSION:  PT End of Session - 01/22/24 1455     Visit Number 1    Date for Recertification  07/21/24    Authorization Type [employer]    PT Start Time 1455    PT Stop Time 1525    PT Time Calculation (min) 30 min    Activity Tolerance Patient tolerated treatment well    Behavior During Therapy WFL for tasks assessed/performed          Past Medical History:  Diagnosis Date   Gestational diabetes    Pregnancy induced hypertension    Seasonal allergies    Velamentous insertion of umbilical cord    Past Surgical History:  Procedure Laterality Date   CESAREAN SECTION N/A 09/11/2023   Procedure: CESAREAN DELIVERY;  Surgeon: Rendell Calton LABOR, DO;  Location: [facility];  Service: Obstetrics;  Laterality: N/A;  EDD 09/18/23   WISDOM TOOTH EXTRACTION Bilateral    Patient Active Problem List   Diagnosis Date Noted   Breech presentation 09/12/2023   Status post primary low transverse cesarean section - breech 09/11/2023   Postpartum care following cesarean delivery 6/23 09/11/2023    PCP: Janey Santos, MD  REFERRING PROVIDER: Barbette Knock, MD   REFERRING DIAG: M62.08 (ICD-10-CM) - Separation of muscle (nontraumatic), other site   THERAPY DIAG:  Muscle weakness (generalized) - Plan: PT plan of care cert/re-cert  Other low back pain - Plan: PT plan of care cert/re-cert  Diastasis recti - Plan: PT plan of care cert/re-cert  Rationale for Evaluation and Treatment: Rehabilitation  ONSET DATE: 2023  SUBJECTIVE:                                                                                                                                                                                           SUBJECTIVE STATEMENT: C-section 09/11/23; also had a child 2.5 years ago.  Fluid intake:   FUNCTIONAL  LIMITATIONS: movement in the morning  PERTINENT HISTORY:  Medications for current condition: none Surgeries: C-section 09/11/23 Other: see above Sexual abuse: No  PAIN:  Are you having pain? Yes NPRS scale: 8/10 Pain location: low back  Pain type: aching and stiff and pressure Pain description: intermittent   Aggravating factors: in the morning Relieving factors: up and moving in the morning  PRECAUTIONS: None  RED FLAGS: None   WEIGHT BEARING RESTRICTIONS: No  FALLS:  Has patient fallen in last 6 months? No  OCCUPATION: sit at desk  ACTIVITY LEVEL : low level  PLOF: Independent  PATIENT GOALS: work on abdominal separation; reduce back pain   BOWEL MOVEMENT: bowel movement 1 time per week   URINATION: Pain with urination: No Fully empty bladder: Yes:                                  Post-void dribble: No Stream: Strong Urgency: can hold urine but not an extended period of time Frequency:during the day every 3 hours                                            Nocturia: No   Leakage: Walking to the bathroom and jumping Pads/briefs: No  INTERCOURSE:  Ability to have vaginal penetration Yes  Pain with intercourse: none   PREGNANCY: Vaginal deliveries 1 Tearing Yes: small Episiotomy No C-section deliveries 1 Currently pregnant No  PROLAPSE: None   OBJECTIVE:  Note: Objective measures were completed at Evaluation unless otherwise noted.  DIAGNOSTIC FINDINGS:  none  PATIENT SURVEYS:  Modified Oswestry:  MODIFIED OSWESTRY DISABILITY SCALE  Date: 01/22/24 Score  Pain intensity 1 = The pain is bad, but I can manage without having to take pain medication  2. Personal care (washing, dressing, etc.) 2 =  It is painful to take care of myself, and I am slow and careful.  3. Lifting 1 = I can lift heavy weights, but it causes increased pain.  4. Walking 0 = Pain does not prevent me from walking any distance  5. Sitting 0 =  I can sit in any chair as long  as I like.  6. Standing 1 =  I can stand as long as I want but, it increases my pain.  7. Sleeping 1 = I can sleep well only by using pain medication.  8. Social Life 1 =  My social life is normal, but it increases my level of pain.  9. Traveling 1 =  I can travel anywhere, but it increases my pain.  10. Employment/ Homemaking 1 = My normal homemaking/job activities increase my pain, but I can still perform all that is required of me  Total 9/50   Interpretation of scores: Score Category Description  0-20% Minimal Disability The patient can cope with most living activities. Usually no treatment is indicated apart from advice on lifting, sitting and exercise  21-40% Moderate Disability The patient experiences more pain and difficulty with sitting, lifting and standing. Travel and social life are more difficult and they may be disabled from work. Personal care, sexual activity and sleeping are not grossly affected, and the patient can usually be managed by conservative means  41-60% Severe Disability Pain remains the main problem in this group, but activities of daily living are affected. These patients require a detailed investigation  61-80% Crippled Back pain impinges on all aspects of the patients life. Positive intervention is required  81-100% Bed-bound These patients are either bed-bound or exaggerating their symptoms  Bluford FORBES Zoe DELENA Karon DELENA, et al. Surgery versus conservative management of stable thoracolumbar fracture: the PRESTO feasibility RCT. [place] ([place]): [employer]; 2021 Nov. [facility] Technology Assessment, No. 25.62.) Appendix 3, Oswestry Disability Index category descriptors. Available from: Findjewelers.cz  Minimally Clinically Important Difference (MCID) = 12.8%   COGNITION: Overall cognitive status: Within functional limits for tasks assessed  FUNCTIONAL TESTS:  Single leg stance:  Rt: steady  Ou:duzjib Squat: able  to with lumbar extension  GAIT: Assistive device utilized: None  POSTURE: decreased lumbar lordosis and buttock clenching   LUMBARAROM/PROM:  A/PROM A/PROM  Eval (% available)  Extension 75  Right lateral flexion 75  Right rotation 75   (Blank rows = not tested)  LOWER EXTREMITY ROM: bilateral hip ROM is full   LOWER EXTREMITY MMT:  MMT Right eval Left eval  Hip abduction 3/5 3/5  Hip adduction 5/5 4/5   (Blank rows = not tested) PALPATION:  General: decreased movement of L5  Pelvic Alignment: ASIS are equal  Abdominal:   Diastasis: Yes: 2 finger width with minimal resistance Distortion: Yes  coning of the abdomen Breathing: difficulty with opening the lower rib cage posteriorly Scar tissue: Yes: decreased movement of the c-section scar                External Perineal Exam: some patches of no pigment in skin                             Internal Pelvic Floor: tightness along the sides of the introitus; sometimes will bear down and requiring verbal cues to breath   Patient confirms identification and approves PT to assess internal pelvic floor and treatment Yes No emotional/communication barriers or cognitive limitation. Patient is motivated to learn. Patient understands and agrees with treatment goals and plan. PT explains patient will be examined in standing, sitting, and lying down to see how their muscles and joints work. When they are ready, they will be asked to remove their underwear so PT can examine their perineum. The patient is also given the option of providing their own chaperone as one is not provided in our facility. The patient also has the right and is explained the right to defer or refuse any part of the evaluation or treatment including the internal exam. With the patient's consent, PT will use one gloved finger to gently assess the muscles of the pelvic floor, seeing how well it contracts and relaxes and if there is muscle symmetry. After, the patient  will get dressed and PT and patient will discuss exam findings and plan of care. PT and patient discuss plan of care, schedule, attendance policy and HEP activities.   PELVIC MMT:   MMT eval  Vaginal 0/5  (Blank rows = not tested)        TONE: Average tone  PROLAPSE: none  TODAY'S TREATMENT:                                                                                                                              DATE: 01/22/24  EVAL Examination completed, findings reviewed, pt educated on POC, HEP, and female pelvic floor anatomy, reasoning with pelvic floor assessment internally with pt consent. Pt motivated to participate in PT and agreeable to attempt  recommendations.     PATIENT EDUCATION:  01/22/24 Education details: Access Code: [email or web address]; c-section scar massage Person educated: Patient Education method: Explanation, Demonstration, Tactile cues, Verbal cues, and Handouts Education comprehension: verbalized understanding, returned demonstration, verbal cues required, tactile cues required, and needs further education  HOME EXERCISE PROGRAM: 01/22/24 Access Code: [email or web address] URL: [email or web address] Date: 01/22/2024 Prepared by: Channing Pereyra  Exercises - Prone Press Up  - 1 x daily - 7 x weekly - 1 sets - 10 reps - Hooklying Transversus Abdominis Palpation  - 1 x daily - 7 x weekly - 1 sets - 10 reps  ASSESSMENT:  CLINICAL IMPRESSION: Patient is a 38 y.o. female who was seen today for physical therapy evaluation and treatment for separation of muscle. Patient had a c-section on 09/11/23. She has abdominal separation that is 2 finger width minimal resistance. Patient leaks with walking to the bathroom and jumping. Patient can hold urine but not an extended period of time. She reports lumbar pain at level 8/10 and happens in the morning. She has decreased movement of L5. She has decreased lumbar ROM. Bilateral hip abduction weakness. She has decreased movement of  her C-section scar. She has difficulty with opening the lower rib cage posteriorly. Patient  will benefit from skilled therapy to reduce pain and improve muscle coordination.   OBJECTIVE IMPAIRMENTS: decreased activity tolerance, decreased coordination, decreased ROM, decreased strength, increased fascial restrictions, increased muscle spasms, and pain.   ACTIVITY LIMITATIONS: lifting, sleeping, and continence  PARTICIPATION LIMITATIONS: shopping and community activity  PERSONAL FACTORS: 1 comorbidity: C-section 09/11/23 are also affecting patient's functional outcome.   REHAB POTENTIAL: Excellent  CLINICAL DECISION MAKING: Stable/uncomplicated  EVALUATION COMPLEXITY: Low   GOALS: Goals reviewed with patient? Yes  SHORT TERM GOALS: Target date: 02/19/24  Patient independent with transverse abdominus to work on abdominal contraction.  Baseline: Goal status: INITIAL  2.  Patient educated on manual work to the pelvic floor.  Baseline:  Goal status: INITIAL   LONG TERM GOALS: Target date: 07/21/24  Patient independent with advanced HEP for core, hips and pelvic floor to improve quality of life.  Baseline:  Goal status: INITIAL  2.  Patient is able to wake up with 0-1/10 back pain and take care of her children.  Baseline:  Goal status: INITIAL  3.  Patient is able to have the urge to void and be able to delay 15 minutes to complete a task and not leak urine due to improve pelvic floor mobility.  Baseline:  Goal status: INITIAL  4.  Patient is able to engage her abdominals with increased tension of the diastasis to lift and take care of her children without increased pressure on the pelvic floor.  Baseline:  Goal status: INITIAL   PLAN:  PT FREQUENCY: 1-2x/week  PT DURATION: 6 months  PLANNED INTERVENTIONS: 97110-Therapeutic exercises, 97530- Therapeutic activity, 97112- Neuromuscular re-education, 97535- Self Care, [id number]- Manual therapy, 20560 (1-2 muscles), 20561 (3+  muscles)- Dry Needling, Patient/Family education, Taping, Joint mobilization, Spinal mobilization, Scar mobilization, Cryotherapy, Moist heat, and Biofeedback  PLAN FOR NEXT SESSION: manual work to the back to bring diastasis together; mobilization to the lumbar; SI mobility; core engagement; mobilization to the c-section scar  Channing Pereyra, PT 01/23/24 8:54 AM  PHYSICAL THERAPY DISCHARGE SUMMARY  Visits from Start of Care: 1  Current functional level related to goals / functional outcomes: See above. Patient left a voice message that  her insurance is changing and she stated she just doesn't have time to do  therapy at this time.     Remaining deficits: See above.    Education / Equipment: HEP   Patient agrees to discharge. Patient goals were not met. Patient is being discharged due to the patient's request. Thank you for the referral.   Channing Pereyra, PT 02/29/2024 8:07 AM

## 2024-01-22 ENCOUNTER — Other Ambulatory Visit: Payer: Self-pay

## 2024-01-22 ENCOUNTER — Encounter: Payer: Self-pay | Admitting: Physical Therapy

## 2024-01-22 ENCOUNTER — Ambulatory Visit: Attending: Obstetrics & Gynecology | Admitting: Physical Therapy

## 2024-01-22 DIAGNOSIS — M6208 Separation of muscle (nontraumatic), other site: Secondary | ICD-10-CM | POA: Diagnosis not present

## 2024-01-22 DIAGNOSIS — M5459 Other low back pain: Secondary | ICD-10-CM | POA: Insufficient documentation

## 2024-01-22 DIAGNOSIS — M6281 Muscle weakness (generalized): Secondary | ICD-10-CM | POA: Diagnosis not present

## 2024-02-01 ENCOUNTER — Encounter: Payer: Self-pay | Admitting: Physical Therapy

## 2024-02-01 DIAGNOSIS — Z1339 Encounter for screening examination for other mental health and behavioral disorders: Secondary | ICD-10-CM | POA: Diagnosis not present

## 2024-02-01 DIAGNOSIS — Z1331 Encounter for screening for depression: Secondary | ICD-10-CM | POA: Diagnosis not present

## 2024-02-01 DIAGNOSIS — Z0000 Encounter for general adult medical examination without abnormal findings: Secondary | ICD-10-CM | POA: Diagnosis not present

## 2024-02-01 DIAGNOSIS — R82998 Other abnormal findings in urine: Secondary | ICD-10-CM | POA: Diagnosis not present

## 2024-02-06 ENCOUNTER — Encounter: Payer: Self-pay | Admitting: Physical Therapy

## 2024-02-20 ENCOUNTER — Encounter: Payer: Self-pay | Admitting: Physical Therapy

## 2024-02-29 ENCOUNTER — Encounter: Payer: Self-pay | Admitting: Physical Therapy

## 2024-03-26 ENCOUNTER — Encounter: Admitting: Physical Therapy

## 2024-03-28 ENCOUNTER — Encounter: Admitting: Physical Therapy

## 2024-04-04 ENCOUNTER — Encounter: Admitting: Physical Therapy

## 2024-04-09 ENCOUNTER — Encounter: Admitting: Physical Therapy

## 2024-04-11 ENCOUNTER — Encounter: Admitting: Physical Therapy

## 2024-04-16 ENCOUNTER — Encounter: Admitting: Physical Therapy

## 2024-04-18 ENCOUNTER — Encounter: Admitting: Physical Therapy

## 2024-04-23 ENCOUNTER — Encounter: Admitting: Physical Therapy

## 2024-04-25 ENCOUNTER — Encounter: Admitting: Physical Therapy

## 2024-04-30 ENCOUNTER — Encounter: Admitting: Physical Therapy

## 2024-05-02 ENCOUNTER — Encounter: Admitting: Physical Therapy
